# Patient Record
Sex: Male | Born: 1950
Health system: Southern US, Community
[De-identification: ages and names within clinical notes are randomized; demographics above are authoritative.]

## PROBLEM LIST (undated history)

## (undated) DIAGNOSIS — K219 Gastro-esophageal reflux disease without esophagitis: Secondary | ICD-10-CM

## (undated) DIAGNOSIS — I219 Acute myocardial infarction, unspecified: Secondary | ICD-10-CM

## (undated) DIAGNOSIS — E7800 Pure hypercholesterolemia, unspecified: Secondary | ICD-10-CM

## (undated) DIAGNOSIS — I10 Essential (primary) hypertension: Secondary | ICD-10-CM

## (undated) HISTORY — PX: JOINT REPLACEMENT: SHX530

---

## 2005-05-24 ENCOUNTER — Inpatient Hospital Stay: Payer: Self-pay | Admitting: Orthopedic Surgery

## 2010-03-20 ENCOUNTER — Ambulatory Visit: Payer: Self-pay | Admitting: Family Medicine

## 2010-03-27 ENCOUNTER — Ambulatory Visit: Payer: Self-pay | Admitting: Family Medicine

## 2010-06-19 ENCOUNTER — Ambulatory Visit: Payer: Self-pay | Admitting: Internal Medicine

## 2012-05-05 ENCOUNTER — Ambulatory Visit: Payer: Self-pay | Admitting: Internal Medicine

## 2013-01-23 ENCOUNTER — Ambulatory Visit: Payer: Self-pay

## 2013-01-26 LAB — BETA STREP CULTURE(ARMC)

## 2013-09-16 ENCOUNTER — Ambulatory Visit: Payer: Self-pay | Admitting: Emergency Medicine

## 2014-11-05 ENCOUNTER — Ambulatory Visit
Admission: EM | Admit: 2014-11-05 | Discharge: 2014-11-05 | Disposition: A | Discharge status: Home / Self Care | Payer: 59 | Attending: Family Medicine | Admitting: Family Medicine

## 2014-11-05 DIAGNOSIS — Z7982 Long term (current) use of aspirin: Secondary | ICD-10-CM | POA: Diagnosis not present

## 2014-11-05 DIAGNOSIS — I252 Old myocardial infarction: Secondary | ICD-10-CM | POA: Insufficient documentation

## 2014-11-05 DIAGNOSIS — J029 Acute pharyngitis, unspecified: Secondary | ICD-10-CM | POA: Insufficient documentation

## 2014-11-05 DIAGNOSIS — Z79899 Other long term (current) drug therapy: Secondary | ICD-10-CM | POA: Insufficient documentation

## 2014-11-05 DIAGNOSIS — E780 Pure hypercholesterolemia: Secondary | ICD-10-CM | POA: Diagnosis not present

## 2014-11-05 DIAGNOSIS — I10 Essential (primary) hypertension: Secondary | ICD-10-CM | POA: Insufficient documentation

## 2014-11-05 DIAGNOSIS — K219 Gastro-esophageal reflux disease without esophagitis: Secondary | ICD-10-CM | POA: Diagnosis not present

## 2014-11-05 HISTORY — DX: Essential (primary) hypertension: I10

## 2014-11-05 HISTORY — DX: Gastro-esophageal reflux disease without esophagitis: K21.9

## 2014-11-05 HISTORY — DX: Acute myocardial infarction, unspecified: I21.9

## 2014-11-05 HISTORY — DX: Pure hypercholesterolemia, unspecified: E78.00

## 2014-11-05 LAB — RAPID STREP SCREEN (MED CTR MEBANE ONLY): Streptococcus, Group A Screen (Direct): NEGATIVE

## 2014-11-05 MED ORDER — LORATADINE-PSEUDOEPHEDRINE ER 10-240 MG PO TB24: 1.0000 | ORAL_TABLET | Freq: Every day | ORAL | Status: DC

## 2014-11-05 NOTE — ED Provider Notes (Signed)
CSN: [id number]     Arrival date & time 11/05/14  1531 History   First MD Initiated Contact with Patient 11/05/14 1625     Chief Complaint  Patient presents with  . Sore Throat   (Consider location/radiation/quality/duration/timing/severity/associated sxs/prior Treatment) Patient is a 64 y.o. male presenting with pharyngitis. The history is provided by the patient. No language interpreter was used.  Sore Throat This is a new problem. The current episode started 12 to 24 hours ago. The problem occurs constantly. The problem has not changed since onset.Pertinent negatives include no chest pain, no abdominal pain, no headaches and no shortness of breath. Nothing aggravates the symptoms. Nothing relieves the symptoms. He has tried nothing for the symptoms.    Past Medical History  Diagnosis Date  . Hypertension   . Hypercholesteremia   . Myocardial infarct   . GERD (gastroesophageal reflux disease)    Past Surgical History  Procedure Laterality Date  . Joint replacement     Family History  Problem Relation Age of Onset  . Irregular heart beat Mother   . Diabetes Father    History  Substance Use Topics  . Smoking status: Never Smoker   . Smokeless tobacco: Not on file  . Alcohol Use: 1.2 oz/week    2 Cans of beer per week     Comment: beers per day    Review of Systems  Constitutional: Negative.   HENT: Positive for postnasal drip, rhinorrhea and sore throat.   Eyes: Negative.   Respiratory: Negative.  Negative for shortness of breath.   Cardiovascular: Negative for chest pain.  Gastrointestinal: Negative for abdominal pain.  Skin: Negative.   Neurological: Negative.  Negative for headaches.    Allergies  Review of patient's allergies indicates no known allergies.  Home Medications   Prior to Admission medications   Medication Sig Start Date End Date Taking? Authorizing Provider  aspirin 81 MG tablet Take 81 mg by mouth daily.   Yes Historical Provider, MD   atorvastatin (LIPITOR) 80 MG tablet Take 80 mg by mouth daily.   Yes Historical Provider, MD  enalapril (VASOTEC) 20 MG tablet Take 20 mg by mouth daily.   Yes Historical Provider, MD  ranitidine (ZANTAC) 150 MG capsule Take 150 mg by mouth 2 (two) times daily.   Yes Historical Provider, MD  loratadine-pseudoephedrine (CLARITIN-D 24 HOUR) 10-240 MG per 24 hr tablet Take 1 tablet by mouth daily. 11/05/14   Frederich Cha, MD   BP 130/75 mmHg  Pulse 68  Temp(Src) 96.6 F (35.9 C) (Tympanic)  Resp 16  Ht 5\' 11"  (1.803 m)  Wt 225 lb (102.059 kg)  BMI 31.39 kg/m2  SpO2 98% Physical Exam  Constitutional: He is oriented to person, place, and time. He appears well-developed and well-nourished.  HENT:  Head: Normocephalic and atraumatic.  Eyes: Pupils are equal, round, and reactive to light.  Neck: Neck supple. No tracheal deviation present.  Musculoskeletal: He exhibits no edema or tenderness.  Lymphadenopathy:    He has cervical adenopathy.  Neurological: He is alert and oriented to person, place, and time.  Skin: Skin is warm.  Psychiatric: He has a normal mood and affect.  Vitals reviewed.   ED Course  Procedures (including critical care time) Labs Review Labs Reviewed  RAPID STREP SCREEN  CULTURE, GROUP A STREP [facility])   Results for orders placed or performed during the hospital encounter of 11/05/14  Rapid strep screen  Result Value Ref Range   Streptococcus, Group A Screen (  Direct) NEGATIVE NEGATIVE    Imaging Review No results found.   MDM   1. Pharyngitis        Frederich Cha, MD 11/05/14 1723

## 2014-11-05 NOTE — ED Notes (Signed)
Woke this morning with a sore throat. Denies headache, no fever or abdominal pain

## 2014-11-05 NOTE — Discharge Instructions (Signed)

## 2014-11-08 LAB — CULTURE, GROUP A STREP (THRC)

## 2015-05-24 ENCOUNTER — Ambulatory Visit
Admission: EM | Admit: 2015-05-24 | Discharge: 2015-05-24 | Disposition: A | Discharge status: Home / Self Care | Payer: 59 | Attending: Family Medicine | Admitting: Family Medicine

## 2015-05-24 DIAGNOSIS — J0110 Acute frontal sinusitis, unspecified: Secondary | ICD-10-CM | POA: Diagnosis not present

## 2015-05-24 DIAGNOSIS — B349 Viral infection, unspecified: Secondary | ICD-10-CM | POA: Diagnosis not present

## 2015-05-24 DIAGNOSIS — H6593 Unspecified nonsuppurative otitis media, bilateral: Secondary | ICD-10-CM

## 2015-05-24 DIAGNOSIS — J029 Acute pharyngitis, unspecified: Secondary | ICD-10-CM | POA: Diagnosis not present

## 2015-05-24 LAB — RAPID STREP SCREEN (MED CTR MEBANE ONLY): Streptococcus, Group A Screen (Direct): NEGATIVE

## 2015-05-24 MED ORDER — ACETAMINOPHEN 500 MG PO TABS: 1000.0000 mg | ORAL_TABLET | Freq: Four times a day (QID) | ORAL | Status: AC | PRN

## 2015-05-24 MED ORDER — SALINE SPRAY 0.65 % NA SOLN: 2.0000 | NASAL | Status: DC

## 2015-05-24 MED ORDER — HYDROCOD POLST-CPM POLST ER 10-8 MG/5ML PO SUER: 5.0000 mL | Freq: Every evening | ORAL | Status: AC | PRN

## 2015-05-24 MED ORDER — AMOXICILLIN-POT CLAVULANATE 875-125 MG PO TABS: 1.0000 | ORAL_TABLET | Freq: Two times a day (BID) | ORAL | Status: DC

## 2015-05-24 NOTE — ED Provider Notes (Addendum)
CSN: [id number]     Arrival date & time 05/24/15  1749 History   First MD Initiated Contact with Patient 05/24/15 1842     Chief Complaint  Patient presents with  . Sore Throat   (Consider location/radiation/quality/duration/timing/severity/associated sxs/prior Treatment) HPI Comments: Married caucasian male here for evaluation sore throat x 4 days worst in am ice and a drink helps, headache frontal, sinus and nasal congestion denied sick contacts.  Had diarrhea last week resolved.  Works at [employer].  Work difficult as the day went on today.  Requesting hydrocodone cough medication to help him sleep.  Patient is a 64 y.o. male presenting with pharyngitis. The history is provided by the patient.  Sore Throat This is a new problem. The current episode started more than 2 days ago. The problem occurs constantly. The problem has not changed since onset.Associated symptoms include headaches. Pertinent negatives include no chest pain, no abdominal pain and no shortness of breath. The symptoms are aggravated by sneezing, drinking, coughing, eating and swallowing. The symptoms are relieved by ice and drinking. He has tried rest, food and water for the symptoms. The treatment provided mild relief.    Past Medical History  Diagnosis Date  . Hypertension   . Hypercholesteremia   . Myocardial infarct ([place])   . GERD (gastroesophageal reflux disease)    Past Surgical History  Procedure Laterality Date  . Joint replacement     Family History  Problem Relation Age of Onset  . Irregular heart beat Mother   . Diabetes Father    Social History  Substance Use Topics  . Smoking status: Never Smoker   . Smokeless tobacco: None  . Alcohol Use: 1.2 oz/week    2 Cans of beer per week     Comment: beers per day    Review of Systems  Constitutional: Positive for fever, chills, activity change and fatigue. Negative for diaphoresis, appetite change and  unexpected weight change.  HENT: Positive for congestion, postnasal drip, rhinorrhea, sinus pressure and sore throat. Negative for dental problem, drooling, ear discharge, ear pain, facial swelling, hearing loss, mouth sores, nosebleeds, sneezing, tinnitus, trouble swallowing and voice change.   Eyes: Negative for photophobia, pain, discharge, redness, itching and visual disturbance.  Respiratory: Positive for cough. Negative for choking, chest tightness, shortness of breath, wheezing and stridor.   Cardiovascular: Negative for chest pain, palpitations and leg swelling.  Gastrointestinal: Positive for diarrhea. Negative for nausea, vomiting, abdominal pain, constipation, blood in stool and abdominal distention.  Endocrine: Negative for cold intolerance and heat intolerance.  Genitourinary: Negative for dysuria.  Musculoskeletal: Negative for myalgias, back pain, joint swelling, arthralgias, gait problem, neck pain and neck stiffness.  Skin: Negative for color change, pallor, rash and wound.  Allergic/Immunologic: Negative for environmental allergies, food allergies and immunocompromised state.  Neurological: Positive for headaches. Negative for dizziness, tremors, seizures, syncope, facial asymmetry, speech difficulty, weakness, light-headedness and numbness.  Hematological: Negative for adenopathy. Does not bruise/bleed easily.  Psychiatric/Behavioral: Negative for behavioral problems, confusion, sleep disturbance and agitation.    Allergies  Review of patient's allergies indicates no known allergies.  Home Medications   Prior to Admission medications   Medication Sig Start Date End Date Taking? Authorizing Provider  atorvastatin (LIPITOR) 80 MG tablet Take 80 mg by mouth daily.   Yes Historical Provider, MD  enalapril (VASOTEC) 20 MG tablet Take 20 mg by mouth daily.   Yes Historical Provider, MD  naproxen (NAPROSYN) 500 MG tablet Take 500  mg by mouth 2 (two) times daily with a meal.   Yes  Historical Provider, MD  ranitidine (ZANTAC) 150 MG capsule Take 150 mg by mouth 2 (two) times daily.   Yes Historical Provider, MD  acetaminophen (TYLENOL) 500 MG tablet Take 2 tablets (1,000 mg total) by mouth every 6 (six) hours as needed for mild pain, moderate pain, fever or headache. 05/24/15 05/30/15  Olen Cordial, NP  amoxicillin-clavulanate (AUGMENTIN) 875-125 MG tablet Take 1 tablet by mouth every 12 (twelve) hours. 05/24/15   Olen Cordial, NP  chlorpheniramine-HYDROcodone (TUSSIONEX PENNKINETIC ER) 10-8 MG/5ML SUER Take 5 mLs by mouth at bedtime as needed for cough. 05/24/15 05/30/15  Olen Cordial, NP  sodium chloride (OCEAN) 0.65 % SOLN nasal spray Place 2 sprays into both nostrils every 2 (two) hours while awake. 05/24/15   Olen Cordial, NP   Meds Ordered and Administered this Visit  Medications - No data to display  BP 121/68 mmHg  Pulse 91  Temp(Src) 97.8 F (36.6 C) (Tympanic)  Resp 15  Ht 5\' 11"  (1.803 m)  Wt 220 lb (99.791 kg)  BMI 30.70 kg/m2  SpO2 96% No data found.   Physical Exam  Constitutional: He is oriented to person, place, and time. Vital signs are normal. He appears well-developed and well-nourished. He is active and cooperative.  Non-toxic appearance. He does not have a sickly appearance. He appears ill. No distress.  HENT:  Head: Normocephalic and atraumatic.  Right Ear: Hearing, external ear and ear canal normal. A middle ear effusion is present.  Left Ear: Hearing, external ear and ear canal normal. A middle ear effusion is present.  Nose: Mucosal edema and rhinorrhea present. No nose lacerations, sinus tenderness, nasal deformity, septal deviation or nasal septal hematoma. No epistaxis.  No foreign bodies. Right sinus exhibits maxillary sinus tenderness and frontal sinus tenderness. Left sinus exhibits maxillary sinus tenderness and frontal sinus tenderness.  Mouth/Throat: Uvula is midline and mucous membranes are normal. Mucous  membranes are not pale, not dry and not cyanotic. He does not have dentures. No oral lesions. No trismus in the jaw. Normal dentition. No dental abscesses, uvula swelling, lacerations or dental caries. Posterior oropharyngeal edema and posterior oropharyngeal erythema present. No oropharyngeal exudate or tonsillar abscesses.  Bilateral TMs with air fluid level; bilateral hearing aids; bilateral nasal turbinates with edema/erythema; lips cracked and bleeding slightly left; cobblestoning posterior pharyx; macular erythema hard palate and tonsils 2+ bilaterally edema/erythema  Eyes: Conjunctivae, EOM and lids are normal. Pupils are equal, round, and reactive to light. Right eye exhibits no chemosis, no discharge, no exudate and no hordeolum. No foreign body present in the right eye. Left eye exhibits no chemosis, no discharge, no exudate and no hordeolum. No foreign body present in the left eye. Right conjunctiva is not injected. Right conjunctiva has no hemorrhage. Left conjunctiva is not injected. Left conjunctiva has no hemorrhage. No scleral icterus. Right eye exhibits normal extraocular motion and no nystagmus. Left eye exhibits normal extraocular motion and no nystagmus. Right pupil is round and reactive. Left pupil is round and reactive. Pupils are equal.  Neck: Trachea normal and normal range of motion. Neck supple. No tracheal tenderness, no spinous process tenderness and no muscular tenderness present. No rigidity. No tracheal deviation, no edema, no erythema and normal range of motion present. No thyroid mass and no thyromegaly present.  Cardiovascular: Normal rate, regular rhythm, S1 normal, S2 normal, normal heart sounds and intact distal pulses.  PMI is  not displaced.  Exam reveals no gallop and no friction rub.   No murmur heard. Pulmonary/Chest: Effort normal and breath sounds normal. No stridor. No respiratory distress. He has no decreased breath sounds. He has no wheezes. He has no rhonchi. He  has no rales.  Negative egophany x 4 quads  Abdominal: Soft. Normal appearance and bowel sounds are normal. He exhibits no shifting dullness, no distension, no pulsatile liver, no fluid wave, no abdominal bruit, no ascites, no pulsatile midline mass and no mass. There is no hepatosplenomegaly. There is no tenderness. There is no rigidity, no rebound, no guarding, no tenderness at McBurney's point and negative Murphy's sign. Hernia confirmed negative in the ventral area.  Dull to percussion x 4 quads  Musculoskeletal: Normal range of motion. He exhibits no edema or tenderness.       Right shoulder: Normal.       Left shoulder: Normal.       Right elbow: Normal.      Left elbow: Normal.       Right hip: Normal.       Left hip: Normal.       Right knee: Normal.       Left knee: Normal.       Right ankle: Normal.       Left ankle: Normal.       Right hand: Normal.       Left hand: Normal.  Lymphadenopathy:       Head (right side): No submental, no submandibular, no tonsillar, no preauricular, no posterior auricular and no occipital adenopathy present.       Head (left side): No submental, no submandibular, no tonsillar, no preauricular, no posterior auricular and no occipital adenopathy present.    He has no cervical adenopathy.       Right cervical: No superficial cervical, no deep cervical and no posterior cervical adenopathy present.      Left cervical: No superficial cervical, no deep cervical and no posterior cervical adenopathy present.  Neurological: He is alert and oriented to person, place, and time. He displays no atrophy and no tremor. No cranial nerve deficit or sensory deficit. He exhibits normal muscle tone. He displays no seizure activity. Coordination and gait normal. GCS eye subscore is 4. GCS verbal subscore is 5. GCS motor subscore is 6.  Skin: Skin is warm, dry and intact. No abrasion, no bruising, no burn, no ecchymosis, no laceration, no lesion, no petechiae and no rash  noted. He is not diaphoretic. No cyanosis or erythema. No pallor. Nails show no clubbing.  Psychiatric: He has a normal mood and affect. His speech is normal and behavior is normal. Judgment and thought content normal. Cognition and memory are normal.  Nursing note and vitals reviewed.   ED Course  Procedures (including critical care time)  Labs Review Labs Reviewed  RAPID STREP SCREEN (NOT AT [facility])  CULTURE, GROUP A STREP ([facility] ONLY)    Imaging Review No results found.  1923 reviewed [place] Controlled Substances website patient has not received any controlled substances in past year.   MDM   1. Acute frontal sinusitis, recurrence not specified   2. Otitis media with effusion, bilateral   3. Acute pharyngitis, unspecified etiology   4. Viral illness    Supportive treatment.   No evidence of invasive bacterial infection, non toxic and well hydrated.  This is most likely self limiting viral infection.  I do not see where any further testing or imaging is necessary  at this time.   I will suggest supportive care, rest, good hygiene and encourage the patient to take adequate fluids.  The patient is to return to clinic or EMERGENCY ROOM if symptoms worsen or change significantly e.g. ear pain, fever, purulent discharge from ears or bleeding.  Exitcare handout on otitis media with effusion given to patient.  Patient verbalized agreement and understanding of treatment plan.    Patient notified rapid strep negative. tussionex 81ml po bedtime prn avoid alcohol intake and caution will cause drowsiness.  Avoid taking this medication at work.   Suspect Viral illness: no evidence of invasive bacterial infection, non toxic and well hydrated.  This is most likely self limiting viral infection.  I do not see where any further testing or imaging is necessary at this time.   I will suggest supportive care, rest, good hygiene and encourage the patient to take adequate fluids.  Does not require work excuse.   Notified patient staff will call with culture results once available next 48+ hours. nasal saline 1-2 sprays each nostril prn q2h, acetaminophen 1000mg  po QID prn.  Discussed honey with lemon and salt water gargles for comfort also.  The patient is to return to clinic or EMERGENCY ROOM if symptoms worsen or change significantly e.g. fever, lethargy, SOB, wheezing.  Exitcare handout on viral illness given to patient.  Patient verbalized agreement and understanding of treatment plan.    Rx augmentin 875mg  po BID x 10 days.  Nasal saline.  Patient refused flonase or nasacort Rx as does not like nose sprays.  Discussed sudafed will counteract his blood pressure medications as does naproxen.  Recommend tylenol 1000mg  po QID prn pain/fever.  No evidence of systemic bacterial infection, non toxic and well hydrated.  I do not see where any further testing or imaging is necessary at this time.   I will suggest supportive care, rest, good hygiene and encourage the patient to take adequate fluids.  The patient is to return to clinic or EMERGENCY ROOM if symptoms worsen or change significantly.  Exitcare handout on sinusitis given to patient.  Patient verbalized agreement and understanding of treatment plan and had no further questions at this time.   P2:  Hand washing and cover cough  Works excuse x 48 hours.  I have recommended clear fluids and bland diet.  Avoid dairy/spicy, fried and large portions of meat while having nausea.  If vomiting hold po intake x 1 hour.  Then sips clear fluids like broths, ginger ale, power ade, gatorade, pedialyte may advance to soft/bland if no vomiting x 24 hours and appetite returned otherwise hydration main focus.     Return to the clinic if symptoms persist or worsen; I have alerted the patient to call if high fever, dehydration, marked weakness, fainting, increased abdominal pain, blood in stool or vomit (red or black).   Exitcare handout on gastroenteritis given to patient. Patient  verbalized agreement and understanding of treatment plan and had no further questions at this time.   Olen Cordial, NP 05/24/15 1930  28 May 2015 at [place] patient via telephone notified throat culture normal negative. Patient reported feeling slightly better still with green productive cough on augmentin.  Tussionex helping him to sleep at night.  To contact me or PCM if still productive cough when augmentin finished.  Patient verbalized understanding of information/instructions, agreed with plan of care and had no further questions at this time.  Olen Cordial, NP 05/28/15 1851

## 2015-05-24 NOTE — ED Notes (Signed)
Patient complains of sore throat, runny nose, cough and congestion with headaches. Patient states that he has been running a fever off and on for a few days. He states that his symptoms started on Thursday of last week-4 days ago.

## 2015-05-24 NOTE — Discharge Instructions (Signed)
Viral Gastroenteritis °Viral gastroenteritis is also known as stomach flu. This condition affects the stomach and intestinal tract. It can cause sudden diarrhea and vomiting. The illness typically lasts 3 to 8 days. Most people develop an immune response that eventually gets rid of the virus. While this natural response develops, the virus can make you quite ill. °CAUSES  °Many different viruses can cause gastroenteritis, such as rotavirus or noroviruses. You can catch one of these viruses by consuming contaminated food or water. You may also catch a virus by sharing utensils or other personal items with an infected person or by touching a contaminated surface. °SYMPTOMS  °The most common symptoms are diarrhea and vomiting. These problems can cause a severe loss of body fluids (dehydration) and a body salt (electrolyte) imbalance. Other symptoms may include: °· Fever. °· Headache. °· Fatigue. °· Abdominal pain. °DIAGNOSIS  °Your caregiver can usually diagnose viral gastroenteritis based on your symptoms and a physical exam. A stool sample may also be taken to test for the presence of viruses or other infections. °TREATMENT  °This illness typically goes away on its own. Treatments are aimed at rehydration. The most serious cases of viral gastroenteritis involve vomiting so severely that you are not able to keep fluids down. In these cases, fluids must be given through an intravenous line (IV). °HOME CARE INSTRUCTIONS  °· Drink enough fluids to keep your urine clear or pale yellow. Drink small amounts of fluids frequently and increase the amounts as tolerated. °· Ask your caregiver for specific rehydration instructions. °· Avoid: °¨ Foods high in sugar. °¨ Alcohol. °¨ Carbonated drinks. °¨ Tobacco. °¨ Juice. °¨ Caffeine drinks. °¨ Extremely hot or cold fluids. °¨ Fatty, greasy foods. °¨ Too much intake of anything at one time. °¨ Dairy products until 24 to 48 hours after diarrhea stops. °· You may consume probiotics.  Probiotics are active cultures of beneficial bacteria. They may lessen the amount and number of diarrheal stools in adults. Probiotics can be found in yogurt with active cultures and in supplements. °· Wash your hands well to avoid spreading the virus. °· Only take over-the-counter or prescription medicines for pain, discomfort, or fever as directed by your caregiver. Do not give aspirin to children. Antidiarrheal medicines are not recommended. °· Ask your caregiver if you should continue to take your regular prescribed and over-the-counter medicines. °· Keep all follow-up appointments as directed by your caregiver. °SEEK IMMEDIATE MEDICAL CARE IF:  °· You are unable to keep fluids down. °· You do not urinate at least once every 6 to 8 hours. °· You develop shortness of breath. °· You notice blood in your stool or vomit. This may look like coffee grounds. °· You have abdominal pain that increases or is concentrated in one small area (localized). °· You have persistent vomiting or diarrhea. °· You have a fever. °· The patient is a child younger than 3 months, and he or she has a fever. °· The patient is a child older than 3 months, and he or she has a fever and persistent symptoms. °· The patient is a child older than 3 months, and he or she has a fever and symptoms suddenly get worse. °· The patient is a baby, and he or she has no tears when crying. °MAKE SURE YOU:  °· Understand these instructions. °· Will watch your condition. °· Will get help right away if you are not doing well or get worse. °  °This information is not intended to replace   advice given to you by your health care provider. Make sure you discuss any questions you have with your health care provider.   Document Released: 06/19/2005 Document Revised: 09/11/2011 Document Reviewed: 04/05/2011 Elsevier Interactive Patient Education 2016 [employer] Viral Infections A virus is a type of germ. Viruses can cause:  Minor sore throats.  Aches and  pains.  Headaches.  Runny nose.  Rashes.  Watery eyes.  Tiredness.  Coughs.  Loss of appetite.  Feeling sick to your stomach (nausea).  Throwing up (vomiting).  Watery poop (diarrhea). HOME CARE   Only take medicines as told by your doctor.  Drink enough water and fluids to keep your pee (urine) clear or pale yellow. Sports drinks are a good choice.  Get plenty of rest and eat healthy. Soups and broths with crackers or rice are fine. GET HELP RIGHT AWAY IF:   You have a very bad headache.  You have shortness of breath.  You have chest pain or neck pain.  You have an unusual rash.  You cannot stop throwing up.  You have watery poop that does not stop.  You cannot keep fluids down.  You or your child has a temperature by mouth above 102 F (38.9 C), not controlled by medicine.  Your baby is older than 3 months with a rectal temperature of 102 F (38.9 C) or higher.  Your baby is 60 months old or younger with a rectal temperature of 100.4 F (38 C) or higher. MAKE SURE YOU:   Understand these instructions.  Will watch this condition.  Will get help right away if you are not doing well or get worse.   This information is not intended to replace advice given to you by your health care provider. Make sure you discuss any questions you have with your health care provider.   Document Released: 06/01/2008 Document Revised: 09/11/2011 Document Reviewed: 11/25/2014 Elsevier Interactive Patient Education 2016 Elsevier Inc. Pharyngitis Pharyngitis is redness, pain, and swelling (inflammation) of your pharynx.  CAUSES  Pharyngitis is usually caused by infection. Most of the time, these infections are from viruses (viral) and are part of a cold. However, sometimes pharyngitis is caused by bacteria (bacterial). Pharyngitis can also be caused by allergies. Viral pharyngitis may be spread from person to person by coughing, sneezing, and personal items or utensils  (cups, forks, spoons, toothbrushes). Bacterial pharyngitis may be spread from person to person by more intimate contact, such as kissing.  SIGNS AND SYMPTOMS  Symptoms of pharyngitis include:   Sore throat.   Tiredness (fatigue).   Low-grade fever.   Headache.  Joint pain and muscle aches.  Skin rashes.  Swollen lymph nodes.  Plaque-like film on throat or tonsils (often seen with bacterial pharyngitis). DIAGNOSIS  Your health care provider will ask you questions about your illness and your symptoms. Your medical history, along with a physical exam, is often all that is needed to diagnose pharyngitis. Sometimes, a rapid strep test is done. Other lab tests may also be done, depending on the suspected cause.  TREATMENT  Viral pharyngitis will usually get better in 3-4 days without the use of medicine. Bacterial pharyngitis is treated with medicines that kill germs (antibiotics).  HOME CARE INSTRUCTIONS   Drink enough water and fluids to keep your urine clear or pale yellow.   Only take over-the-counter or prescription medicines as directed by your health care provider:   If you are prescribed antibiotics, make sure you finish them even if you start  to feel better.   Do not take aspirin.   Get lots of rest.   Gargle with 8 oz of salt water ( tsp of salt per 1 qt of water) as often as every 1-2 hours to soothe your throat.   Throat lozenges (if you are not at risk for choking) or sprays may be used to soothe your throat. SEEK MEDICAL CARE IF:   You have large, tender lumps in your neck.  You have a rash.  You cough up green, yellow-brown, or bloody spit. SEEK IMMEDIATE MEDICAL CARE IF:   Your neck becomes stiff.  You drool or are unable to swallow liquids.  You vomit or are unable to keep medicines or liquids down.  You have severe pain that does not go away with the use of recommended medicines.  You have trouble breathing (not caused by a stuffy  nose). MAKE SURE YOU:   Understand these instructions.  Will watch your condition.  Will get help right away if you are not doing well or get worse.   This information is not intended to replace advice given to you by your health care provider. Make sure you discuss any questions you have with your health care provider.   Document Released: 06/19/2005 Document Revised: 04/09/2013 Document Reviewed: 02/24/2013 Elsevier Interactive Patient Education 2016 [place]. Otitis Media With Effusion Otitis media with effusion is the presence of fluid in the middle ear. This is a common problem in children, which often follows ear infections. It may be present for weeks or longer after the infection. Unlike an acute ear infection, otitis media with effusion refers only to fluid behind the ear drum and not infection. Children with repeated ear and sinus infections and allergy problems are the most likely to get otitis media with effusion. CAUSES  The most frequent cause of the fluid buildup is dysfunction of the eustachian tubes. These are the tubes that drain fluid in the ears to the back of the nose (nasopharynx). SYMPTOMS   The main symptom of this condition is hearing loss. As a result, you or your child may:  Listen to the TV at a loud volume.  Not respond to questions.  Ask "what" often when spoken to.  Mistake or confuse one sound or word for another.  There may be a sensation of fullness or pressure but usually not pain. DIAGNOSIS   Your health care provider will diagnose this condition by examining you or your child's ears.  Your health care provider may test the pressure in you or your child's ear with a tympanometer.  A hearing test may be conducted if the problem persists. TREATMENT   Treatment depends on the duration and the effects of the effusion.  Antibiotics, decongestants, nose drops, and cortisone-type drugs (tablets or nasal spray) may not be helpful.  Children  with persistent ear effusions may have delayed language or behavioral problems. Children at risk for developmental delays in hearing, learning, and speech may require referral to a specialist earlier than children not at risk.  You or your child's health care provider may suggest a referral to an ear, nose, and throat surgeon for treatment. The following may help restore normal hearing:  Drainage of fluid.  Placement of ear tubes (tympanostomy tubes).  Removal of adenoids (adenoidectomy). HOME CARE INSTRUCTIONS   Avoid secondhand smoke.  Infants who are breastfed are less likely to have this condition.  Avoid feeding infants while they are lying flat.  Avoid known environmental allergens.  Avoid people who are sick. SEEK MEDICAL CARE IF:   Hearing is not better in 3 months.  Hearing is worse.  Ear pain.  Drainage from the ear.  Dizziness. MAKE SURE YOU:   Understand these instructions.  Will watch your condition.  Will get help right away if you are not doing well or get worse.   This information is not intended to replace advice given to you by your health care provider. Make sure you discuss any questions you have with your health care provider.   Document Released: 07/27/2004 Document Revised: 07/10/2014 Document Reviewed: 01/14/2013 Elsevier Interactive Patient Education 2016 Elsevier Inc. Sinusitis, Adult Sinusitis is redness, soreness, and inflammation of the paranasal sinuses. Paranasal sinuses are air pockets within the bones of your face. They are located beneath your eyes, in the middle of your forehead, and above your eyes. In healthy paranasal sinuses, mucus is able to drain out, and air is able to circulate through them by way of your nose. However, when your paranasal sinuses are inflamed, mucus and air can become trapped. This can allow bacteria and other germs to grow and cause infection. Sinusitis can develop quickly and last only a short time (acute) or  continue over a long period (chronic). Sinusitis that lasts for more than 12 weeks is considered chronic. CAUSES Causes of sinusitis include:  Allergies.  Structural abnormalities, such as displacement of the cartilage that separates your nostrils (deviated septum), which can decrease the air flow through your nose and sinuses and affect sinus drainage.  Functional abnormalities, such as when the small hairs (cilia) that line your sinuses and help remove mucus do not work properly or are not present. SIGNS AND SYMPTOMS Symptoms of acute and chronic sinusitis are the same. The primary symptoms are pain and pressure around the affected sinuses. Other symptoms include:  Upper toothache.  Earache.  Headache.  Bad breath.  Decreased sense of smell and taste.  A cough, which worsens when you are lying flat.  Fatigue.  Fever.  Thick drainage from your nose, which often is green and may contain pus (purulent).  Swelling and warmth over the affected sinuses. DIAGNOSIS Your health care provider will perform a physical exam. During your exam, your health care provider may perform any of the following to help determine if you have acute sinusitis or chronic sinusitis:  Look in your nose for signs of abnormal growths in your nostrils (nasal polyps).  Tap over the affected sinus to check for signs of infection.  View the inside of your sinuses using an imaging device that has a light attached (endoscope). If your health care provider suspects that you have chronic sinusitis, one or more of the following tests may be recommended:  Allergy tests.  Nasal culture. A sample of mucus is taken from your nose, sent to a lab, and screened for bacteria.  Nasal cytology. A sample of mucus is taken from your nose and examined by your health care provider to determine if your sinusitis is related to an allergy. TREATMENT Most cases of acute sinusitis are related to a viral infection and will  resolve on their own within 10 days. Sometimes, medicines are prescribed to help relieve symptoms of both acute and chronic sinusitis. These may include pain medicines, decongestants, nasal steroid sprays, or saline sprays. However, for sinusitis related to a bacterial infection, your health care provider will prescribe antibiotic medicines. These are medicines that will help kill the bacteria causing the infection. Rarely, sinusitis is caused  by a fungal infection. In these cases, your health care provider will prescribe antifungal medicine. For some cases of chronic sinusitis, surgery is needed. Generally, these are cases in which sinusitis recurs more than 3 times per year, despite other treatments. HOME CARE INSTRUCTIONS  Drink plenty of water. Water helps thin the mucus so your sinuses can drain more easily.  Use a humidifier.  Inhale steam 3-4 times a day (for example, sit in the bathroom with the shower running).  Apply a warm, moist washcloth to your face 3-4 times a day, or as directed by your health care provider.  Use saline nasal sprays to help moisten and clean your sinuses.  Take medicines only as directed by your health care provider.  If you were prescribed either an antibiotic or antifungal medicine, finish it all even if you start to feel better. SEEK IMMEDIATE MEDICAL CARE IF:  You have increasing pain or severe headaches.  You have nausea, vomiting, or drowsiness.  You have swelling around your face.  You have vision problems.  You have a stiff neck.  You have difficulty breathing.   This information is not intended to replace advice given to you by your health care provider. Make sure you discuss any questions you have with your health care provider.   Document Released: 06/19/2005 Document Revised: 07/10/2014 Document Reviewed: 07/04/2011 Elsevier Interactive Patient Education [employer].

## 2015-05-26 LAB — CULTURE, GROUP A STREP (THRC)

## 2015-06-02 ENCOUNTER — Telehealth: Payer: Self-pay

## 2015-06-02 NOTE — ED Notes (Signed)
Patient called stating was returning a call from Gerarda Fraction NP. He states she called an offered to call him in "a second round of antibiotics". Chart reviewed. Patient states is still on Augmentin. Informed patient that from reading notes, he was instructed to return to [place] or f/u with PCP if still having colored drainage after completing Augmentin.

## 2015-06-03 MED ORDER — AZITHROMYCIN 250 MG PO TABS: 250.0000 mg | ORAL_TABLET | Freq: Every day | ORAL | Status: AC

## 2015-06-03 NOTE — Telephone Encounter (Signed)
Patient contacted via telephone and reported still some rhinitis and post nasal drip productive green cough despite augmentin and nasal saline.  Low grade fever.  Rx for azithromycin 500mg  po today and 250mg  po daily days 2-5 sent to [facility] location.  Patient to follow up for re-evaluation if no improvement of symptoms with PCM or [facility] provider.  Patient verbalized understanding of information/instructions, agreed with plan of care and had no further questions at this time.

## 2017-06-03 ENCOUNTER — Ambulatory Visit
Admission: EM | Admit: 2017-06-03 | Discharge: 2017-06-03 | Disposition: A | Discharge status: Home / Self Care | Payer: 59 | Attending: Family Medicine | Admitting: Family Medicine

## 2017-06-03 ENCOUNTER — Encounter: Payer: Self-pay | Admitting: Gynecology

## 2017-06-03 ENCOUNTER — Other Ambulatory Visit: Payer: Self-pay

## 2017-06-03 DIAGNOSIS — J0100 Acute maxillary sinusitis, unspecified: Secondary | ICD-10-CM

## 2017-06-03 LAB — RAPID STREP SCREEN (MED CTR MEBANE ONLY): Streptococcus, Group A Screen (Direct): NEGATIVE

## 2017-06-03 MED ORDER — AMOXICILLIN-POT CLAVULANATE 875-125 MG PO TABS: 1.0000 | ORAL_TABLET | Freq: Two times a day (BID) | ORAL | 0 refills | Status: DC

## 2017-06-03 MED ORDER — HYDROCOD POLST-CPM POLST ER 10-8 MG/5ML PO SUER: 5.0000 mL | Freq: Two times a day (BID) | ORAL | 0 refills | Status: DC | PRN

## 2017-06-03 NOTE — ED Triage Notes (Signed)
Patient c/o sinusitis and sore throat x 1 week.

## 2017-06-03 NOTE — ED Provider Notes (Signed)
[facility]    CSN: [id number] Arrival date & time: 06/03/17  0803  History   Chief Complaint Chief Complaint  Patient presents with  . Sore Throat    HPI  66 year old male presents with sore throat, drainage, cough.  Patient has been sick for the past week.  He states that he has had sore throat, postnasal drainage, sinus pressure and congestion, and cough.  Symptoms are moderate in severity.  He is used over-the-counter medications without improvement.  No associated fevers or chills.  No reported sick contacts.  No known exacerbating relieving factors.  No other complaints or concerns at this time.  Past Medical History:  Diagnosis Date  . GERD (gastroesophageal reflux disease)   . Hypercholesteremia   . Hypertension   . Myocardial infarct [facility])    Past Surgical History:  Procedure Laterality Date  . JOINT REPLACEMENT     Home Medications    Prior to Admission medications   Medication Sig Start Date End Date Taking? Authorizing Provider  atorvastatin (LIPITOR) 80 MG tablet Take 80 mg by mouth daily.   Yes [provider]  enalapril (VASOTEC) 20 MG tablet Take 20 mg by mouth daily.   Yes [provider]  naproxen (NAPROSYN) 500 MG tablet Take 500 mg by mouth 2 (two) times daily with a meal.   Yes [provider]  ranitidine (ZANTAC) 150 MG capsule Take 150 mg by mouth 2 (two) times daily.   Yes [provider]  amoxicillin-clavulanate (AUGMENTIN) 875-125 MG tablet Take 1 tablet by mouth every 12 (twelve) hours. 06/03/17   Coral Spikes, DO  chlorpheniramine-HYDROcodone (TUSSIONEX PENNKINETIC ER) 10-8 MG/5ML SUER Take 5 mLs by mouth every 12 (twelve) hours as needed. 06/03/17   Coral Spikes, DO  sodium chloride (OCEAN) 0.65 % SOLN nasal spray Place 2 sprays into both nostrils every 2 (two) hours while awake. 05/24/15   Betancourt, Aura Fey, NP    Family History Family History  Problem Relation Age of Onset  . Irregular heart  beat Mother   . Diabetes Father     Social History Social History   Tobacco Use  . Smoking status: Never Smoker  . Smokeless tobacco: Never Used  Substance Use Topics  . Alcohol use: Yes    Alcohol/week: 1.2 oz    Types: 2 Cans of beer per week    Comment: beers per day  . Drug use: Not on file     Allergies   Patient has no known allergies.   Review of Systems Review of Systems  Constitutional: Negative for fever.  HENT: Positive for congestion, postnasal drip, sinus pressure and sore throat.   Respiratory: Positive for cough.    Physical Exam Triage Vital Signs ED Triage Vitals  Enc Vitals Group     BP 06/03/17 0814 (!) 147/68     Pulse Rate 06/03/17 0814 84     Resp 06/03/17 0814 16     Temp 06/03/17 0814 98.1 F (36.7 C)     Temp Source 06/03/17 0814 Oral     SpO2 06/03/17 0814 98 %     Weight 06/03/17 0816 226 lb (102.5 kg)     Height 06/03/17 0816 5\' 11"  (1.803 m)     Head Circumference --      Peak Flow --      Pain Score 06/03/17 0816 6     Pain Loc --      Pain Edu? --  Excl. in GC? --    No data found.  Updated Vital Signs BP (!) 147/68 (BP Location: Left Arm)   Pulse 84   Temp 98.1 F (36.7 C) (Oral)   Resp 16   Ht 5\' 11"  (1.803 m)   Wt 226 lb (102.5 kg)   SpO2 98%   BMI 31.52 kg/m   Physical Exam  Constitutional: He is oriented to person, place, and time. He appears well-developed and well-nourished. No distress.  HENT:  Head: Normocephalic and atraumatic.  Oropharynx with mild erythema.  No exudate.  TMs normal bilaterally. Mild maxillary sinus tenderness to palpation.  Neck: Neck supple.  Cardiovascular: Normal rate and regular rhythm.  No murmur heard. Pulmonary/Chest: Effort normal and breath sounds normal. He has no wheezes. He has no rales.  Lymphadenopathy:    He has no cervical adenopathy.  Neurological: He is alert and oriented to person, place, and time.  Psychiatric: He has a normal mood and affect. His behavior is  normal.  Nursing note and vitals reviewed.  UC Treatments / Results  Labs (all labs ordered are listed, but only abnormal results are displayed) Labs Reviewed  RAPID STREP SCREEN (NOT AT [facility])  CULTURE, GROUP A STREP [facility])    EKG  EKG Interpretation None       Radiology No results found.  Procedures Procedures (including critical care time)  Medications Ordered in UC Medications - No data to display   Initial Impression / Assessment and Plan / UC Course  I have reviewed the triage vital signs and the nursing notes.  Pertinent labs & imaging results that were available during my care of the patient were reviewed by me and considered in my medical decision making (see chart for details).     66 year old male presents with acute sinusitis.  Treated with Augmentin.  Tussionex for cough.  Final Clinical Impressions(s) / UC Diagnoses   Final diagnoses:  Acute non-recurrent maxillary sinusitis    ED Discharge Orders        Ordered    amoxicillin-clavulanate (AUGMENTIN) 875-125 MG tablet  Every 12 hours     06/03/17 0828    chlorpheniramine-HYDROcodone (TUSSIONEX PENNKINETIC ER) 10-8 MG/5ML SUER  Every 12 hours PRN     06/03/17 0828      Controlled Substance Prescriptions [place] Controlled Substance Registry consulted? No   Coral Spikes, [place] 06/03/17 [phone]

## 2017-06-06 LAB — CULTURE, GROUP A STREP (THRC)

## 2017-06-30 ENCOUNTER — Ambulatory Visit
Admission: EM | Admit: 2017-06-30 | Discharge: 2017-06-30 | Disposition: A | Discharge status: Home / Self Care | Payer: 59 | Attending: Family Medicine | Admitting: Family Medicine

## 2017-06-30 ENCOUNTER — Other Ambulatory Visit: Payer: Self-pay

## 2017-06-30 ENCOUNTER — Encounter: Payer: Self-pay | Admitting: *Deleted

## 2017-06-30 DIAGNOSIS — J0100 Acute maxillary sinusitis, unspecified: Secondary | ICD-10-CM

## 2017-06-30 DIAGNOSIS — R05 Cough: Secondary | ICD-10-CM | POA: Diagnosis not present

## 2017-06-30 MED ORDER — AMOXICILLIN-POT CLAVULANATE 875-125 MG PO TABS: 1.0000 | ORAL_TABLET | Freq: Two times a day (BID) | ORAL | 0 refills | Status: DC

## 2017-06-30 MED ORDER — HYDROCOD POLST-CPM POLST ER 10-8 MG/5ML PO SUER: 5.0000 mL | Freq: Two times a day (BID) | ORAL | 0 refills | Status: DC | PRN

## 2017-06-30 NOTE — ED Provider Notes (Signed)
[facility]    CSN: [id number] Arrival date & time: 06/30/17  0915     History   Chief Complaint Chief Complaint  Patient presents with  . Cough  . Nasal Congestion    HPI Richard Jensen is a 66 y.o. male.   66 yo male with a c/o sinus pressure, sinus headaches which did not completely resolved since he was seen here about 3 weeks ago. States symptoms improved and seemed to be resolving on the Augmentin prescribed, however symptoms did not seem to completely go away. Denies any chest pain or shortness of breath.    The history is provided by the patient.    Past Medical History:  Diagnosis Date  . GERD (gastroesophageal reflux disease)   . Hypercholesteremia   . Hypertension   . Myocardial infarct ([place])     There are no active problems to display for this patient.   Past Surgical History:  Procedure Laterality Date  . JOINT REPLACEMENT         Home Medications    Prior to Admission medications   Medication Sig Start Date End Date Taking? Authorizing Provider  amLODipine (NORVASC) 10 MG tablet Take 10 mg by mouth daily.   Yes [provider]  atorvastatin (LIPITOR) 80 MG tablet Take 80 mg by mouth daily.   Yes [provider]  enalapril (VASOTEC) 20 MG tablet Take 20 mg by mouth daily.   Yes [provider]  naproxen (NAPROSYN) 500 MG tablet Take 500 mg by mouth 2 (two) times daily with a meal.   Yes [provider]  ranitidine (ZANTAC) 150 MG capsule Take 150 mg by mouth 2 (two) times daily.   Yes [provider]  sodium chloride (OCEAN) 0.65 % SOLN nasal spray Place 2 sprays into both nostrils every 2 (two) hours while awake. 05/24/15  Yes Betancourt, Aura Fey, NP  amoxicillin-clavulanate (AUGMENTIN) 875-125 MG tablet Take 1 tablet by mouth every 12 (twelve) hours. 06/30/17   Norval Gable, MD  chlorpheniramine-HYDROcodone (TUSSIONEX PENNKINETIC ER) 10-8 MG/5ML SUER Take 5 mLs by mouth every 12 (twelve)  hours as needed. 06/30/17   Norval Gable, MD    Family History Family History  Problem Relation Age of Onset  . Irregular heart beat Mother   . Diabetes Father     Social History Social History   Tobacco Use  . Smoking status: Never Smoker  . Smokeless tobacco: Never Used  Substance Use Topics  . Alcohol use: Yes    Alcohol/week: 1.2 oz    Types: 2 Cans of beer per week    Comment: beers per day  . Drug use: Not on file     Allergies   Patient has no known allergies.   Review of Systems Review of Systems   Physical Exam Triage Vital Signs ED Triage Vitals  Enc Vitals Group     BP 06/30/17 0943 130/65     Pulse Rate 06/30/17 0943 (!) 53     Resp 06/30/17 0943 16     Temp 06/30/17 0943 97.7 F (36.5 C)     Temp Source 06/30/17 0943 Oral     SpO2 06/30/17 0943 100 %     Weight --      Height --      Head Circumference --      Peak Flow --      Pain Score 06/30/17 0945 0     Pain Loc --      Pain Edu? --  Excl. in [place]? --    No data found.  Updated Vital Signs BP 130/65 (BP Location: Left Arm)   Pulse (!) 53   Temp 97.7 F (36.5 C) (Oral)   Resp 16   SpO2 100%   Visual Acuity Right Eye Distance:   Left Eye Distance:   Bilateral Distance:    Right Eye Near:   Left Eye Near:    Bilateral Near:     Physical Exam  Constitutional: He appears well-developed and well-nourished. No distress.  HENT:  Head: Normocephalic and atraumatic.  Right Ear: Tympanic membrane, external ear and ear canal normal.  Left Ear: Tympanic membrane, external ear and ear canal normal.  Nose: Right sinus exhibits maxillary sinus tenderness and frontal sinus tenderness. Left sinus exhibits maxillary sinus tenderness and frontal sinus tenderness.  Mouth/Throat: Uvula is midline, oropharynx is clear and moist and mucous membranes are normal. No oropharyngeal exudate or tonsillar abscesses.  Eyes: Conjunctivae and EOM are normal. Pupils are equal, round, and reactive to  light. Right eye exhibits no discharge. Left eye exhibits no discharge. No scleral icterus.  Neck: Normal range of motion. Neck supple. No tracheal deviation present. No thyromegaly present.  Cardiovascular: Normal rate, regular rhythm and normal heart sounds.  Pulmonary/Chest: Effort normal and breath sounds normal. No stridor. No respiratory distress. He has no wheezes. He has no rales. He exhibits no tenderness.  Lymphadenopathy:    He has no cervical adenopathy.  Neurological: He is alert.  Skin: Skin is warm and dry. No rash noted. He is not diaphoretic.  Nursing note and vitals reviewed.    UC Treatments / Results  Labs (all labs ordered are listed, but only abnormal results are displayed) Labs Reviewed - No data to display  EKG  EKG Interpretation None       Radiology No results found.  Procedures Procedures (including critical care time)  Medications Ordered in UC Medications - No data to display   Initial Impression / Assessment and Plan / UC Course  I have reviewed the triage vital signs and the nursing notes.  Pertinent labs & imaging results that were available during my care of the patient were reviewed by me and considered in my medical decision making (see chart for details).       Final Clinical Impressions(s) / UC Diagnoses   Final diagnoses:  Acute maxillary sinusitis, recurrence not specified    ED Discharge Orders        Ordered    chlorpheniramine-HYDROcodone (TUSSIONEX PENNKINETIC ER) 10-8 MG/5ML SUER  Every 12 hours PRN     06/30/17 1004    amoxicillin-clavulanate (AUGMENTIN) 875-125 MG tablet  Every 12 hours     06/30/17 1004     1. diagnosis reviewed with patient 2. rx as per orders above; reviewed possible side effects, interactions, risks and benefits  3. Recommend supportive treatment with otc analgesics prn, rest, fluids  4. Follow-up prn if symptoms worsen or don't improve  Controlled Substance Prescriptions  Controlled  Substance Registry consulted? Not Applicable   Norval Gable, MD 06/30/17 1010

## 2017-06-30 NOTE — ED Triage Notes (Signed)
Patient started having recurrent symptoms of cough chest congestion, and nasal congestion 4 days ago.

## 2017-07-16 DIAGNOSIS — H25012 Cortical age-related cataract, left eye: Secondary | ICD-10-CM | POA: Diagnosis not present

## 2017-07-16 DIAGNOSIS — H2512 Age-related nuclear cataract, left eye: Secondary | ICD-10-CM | POA: Diagnosis not present

## 2017-07-16 DIAGNOSIS — H25042 Posterior subcapsular polar age-related cataract, left eye: Secondary | ICD-10-CM | POA: Diagnosis not present

## 2017-07-24 DIAGNOSIS — H25812 Combined forms of age-related cataract, left eye: Secondary | ICD-10-CM | POA: Diagnosis not present

## 2017-07-24 DIAGNOSIS — H2512 Age-related nuclear cataract, left eye: Secondary | ICD-10-CM | POA: Diagnosis not present

## 2017-10-18 DIAGNOSIS — E7800 Pure hypercholesterolemia, unspecified: Secondary | ICD-10-CM | POA: Diagnosis not present

## 2017-10-18 DIAGNOSIS — I2510 Atherosclerotic heart disease of native coronary artery without angina pectoris: Secondary | ICD-10-CM | POA: Diagnosis not present

## 2017-10-18 DIAGNOSIS — I10 Essential (primary) hypertension: Secondary | ICD-10-CM | POA: Diagnosis not present

## 2018-01-07 DIAGNOSIS — M2041 Other hammer toe(s) (acquired), right foot: Secondary | ICD-10-CM | POA: Diagnosis not present

## 2018-01-07 DIAGNOSIS — B070 Plantar wart: Secondary | ICD-10-CM | POA: Diagnosis not present

## 2018-01-07 DIAGNOSIS — M2042 Other hammer toe(s) (acquired), left foot: Secondary | ICD-10-CM | POA: Diagnosis not present

## 2018-01-21 DIAGNOSIS — B070 Plantar wart: Secondary | ICD-10-CM | POA: Diagnosis not present

## 2018-01-21 DIAGNOSIS — M2042 Other hammer toe(s) (acquired), left foot: Secondary | ICD-10-CM | POA: Diagnosis not present

## 2018-01-21 DIAGNOSIS — M2041 Other hammer toe(s) (acquired), right foot: Secondary | ICD-10-CM | POA: Diagnosis not present

## 2018-02-01 DIAGNOSIS — M180 Bilateral primary osteoarthritis of first carpometacarpal joints: Secondary | ICD-10-CM | POA: Diagnosis not present

## 2018-02-01 DIAGNOSIS — G5603 Carpal tunnel syndrome, bilateral upper limbs: Secondary | ICD-10-CM | POA: Diagnosis not present

## 2018-02-22 DIAGNOSIS — G5603 Carpal tunnel syndrome, bilateral upper limbs: Secondary | ICD-10-CM | POA: Diagnosis not present

## 2018-03-29 DIAGNOSIS — G5603 Carpal tunnel syndrome, bilateral upper limbs: Secondary | ICD-10-CM | POA: Diagnosis not present

## 2018-04-04 DIAGNOSIS — I2510 Atherosclerotic heart disease of native coronary artery without angina pectoris: Secondary | ICD-10-CM | POA: Diagnosis not present

## 2018-04-04 DIAGNOSIS — E7800 Pure hypercholesterolemia, unspecified: Secondary | ICD-10-CM | POA: Diagnosis not present

## 2018-04-04 DIAGNOSIS — I10 Essential (primary) hypertension: Secondary | ICD-10-CM | POA: Diagnosis not present

## 2018-04-14 ENCOUNTER — Ambulatory Visit
Admission: EM | Admit: 2018-04-14 | Discharge: 2018-04-14 | Disposition: A | Discharge status: Home / Self Care | Payer: 59 | Attending: Emergency Medicine | Admitting: Emergency Medicine

## 2018-04-14 ENCOUNTER — Other Ambulatory Visit: Payer: Self-pay

## 2018-04-14 DIAGNOSIS — J069 Acute upper respiratory infection, unspecified: Secondary | ICD-10-CM

## 2018-04-14 MED ORDER — BENZONATATE 100 MG PO CAPS: 100.0000 mg | ORAL_CAPSULE | Freq: Three times a day (TID) | ORAL | 0 refills | Status: DC | PRN

## 2018-04-14 MED ORDER — HYDROCOD POLST-CPM POLST ER 10-8 MG/5ML PO SUER: 5.0000 mL | Freq: Every evening | ORAL | 0 refills | Status: DC | PRN

## 2018-04-14 MED ORDER — FLUTICASONE PROPIONATE 50 MCG/ACT NA SUSP: 1.0000 | Freq: Every day | NASAL | 0 refills | Status: DC

## 2018-04-14 NOTE — ED Provider Notes (Signed)
[facility] ____________________________________________  Time seen: Approximately 8:28 AM  I have reviewed the triage vital signs and the nursing notes.   HISTORY  Chief Complaint Nasal Congestion  HPI Richard Jensen is a 67 y.o. male presenting for evaluation of 3 days of runny nose, nasal congestion, cough and scratchy throat.  Denies fevers.  Has not tried any medication over-the-counter for the same complaints.  Continues to eat and drink well.  Reports sick contacts at work this past week with similar complaints.  Denies other aggravating or alleviating factors.  States cough does intermittently disrupt sleep.  Reports otherwise doing well.  Denies chest pain, shortness of breath, abdominal pain, recent sickness or recent antibiotic use.  Valera Castle, MD: PCP   Past Medical History:  Diagnosis Date  . GERD (gastroesophageal reflux disease)   . Hypercholesteremia   . Hypertension   . Myocardial infarct ([place])     There are no active problems to display for this patient.   Past Surgical History:  Procedure Laterality Date  . JOINT REPLACEMENT       No current facility-administered medications for this encounter.   Current Outpatient Medications:  .  amLODipine (NORVASC) 10 MG tablet, Take 10 mg by mouth daily., Disp: , Rfl:  .  atorvastatin (LIPITOR) 80 MG tablet, Take 80 mg by mouth daily., Disp: , Rfl:  .  benzonatate (TESSALON PERLES) 100 MG capsule, Take 1 capsule (100 mg total) by mouth 3 (three) times daily as needed for cough., Disp: 15 capsule, Rfl: 0 .  chlorpheniramine-HYDROcodone (TUSSIONEX PENNKINETIC ER) 10-8 MG/5ML SUER, Take 5 mLs by mouth at bedtime as needed for cough. do not drive or operate machinery while taking as can cause drowsiness., Disp: 50 mL, Rfl: 0 .  enalapril (VASOTEC) 20 MG tablet, Take 20 mg by mouth daily., Disp: , Rfl:  .  fluticasone (FLONASE) 50 MCG/ACT nasal spray, Place 1 spray into both nostrils daily., Disp:  1 g, Rfl: 0 .  naproxen (NAPROSYN) 500 MG tablet, Take 500 mg by mouth 2 (two) times daily with a meal., Disp: , Rfl:  .  ranitidine (ZANTAC) 150 MG capsule, Take 150 mg by mouth 2 (two) times daily., Disp: , Rfl:   Allergies Patient has no known allergies.  Family History  Problem Relation Age of Onset  . Irregular heart beat Mother   . Diabetes Father     Social History Social History   Tobacco Use  . Smoking status: Never Smoker  . Smokeless tobacco: Never Used  Substance Use Topics  . Alcohol use: Yes    Alcohol/week: 2.0 standard drinks    Types: 2 Cans of beer per week    Comment: beers per day  . Drug use: Never    Review of Systems Constitutional: No fever ENT:AS above.  Cardiovascular: Denies chest pain. Respiratory: Denies shortness of breath. Gastrointestinal: No abdominal pain. Musculoskeletal: Negative for back pain. Skin: Negative for rash.   ____________________________________________   PHYSICAL EXAM:  VITAL SIGNS: ED Triage Vitals  Enc Vitals Group     BP 04/14/18 0808 (!) 144/78     Pulse Rate 04/14/18 0808 76     Resp 04/14/18 0808 16     Temp 04/14/18 0808 98.2 F (36.8 C)     Temp Source 04/14/18 0808 Oral     SpO2 04/14/18 0808 99 %     Weight 04/14/18 0809 230 lb (104.3 kg)     Height 04/14/18 0809 5\' 11"  (1.803 m)  Head Circumference --      Peak Flow --      Pain Score 04/14/18 0809 8     Pain Loc --      Pain Edu? --      Excl. in [place]? --     Constitutional: Alert and oriented. Well appearing and in no acute distress. Eyes: Conjunctivae are normal.  Head: [email or web address] bilateral frontal and maxillary sinus tenderness to palpation.  No swelling. No erythema.  Ears: no erythema, normal TMs bilaterally.   Nose:Nasal congestion with clear rhinorrhea  Mouth/Throat: Mucous membranes are moist. No pharyngeal erythema. No tonsillar swelling or exudate.  Neck: No stridor.  No cervical spine tenderness to  palpation. Hematological/Lymphatic/Immunilogical: No cervical lymphadenopathy. Cardiovascular: Normal rate, regular rhythm. Grossly normal heart sounds.  Good peripheral circulation. Respiratory: Normal respiratory effort.  No retractions. No wheezes, rales or rhonchi. Good air movement.  Musculoskeletal: Ambulatory with steady gait.  Neurologic:  Normal speech and language. No gait instability. Skin:  Skin appears warm, dry and intact. No rash noted. Psychiatric: Mood and affect are normal. Speech and behavior are normal.  ___________________________________________   LABS (all labs ordered are listed, but only abnormal results are displayed)  Labs Reviewed - No data to display ____________________________________________   PROCEDURES Procedures   INITIAL IMPRESSION / ASSESSMENT AND PLAN / ED COURSE  Pertinent labs & imaging results that were available during my care of the patient were reviewed by me and considered in my medical decision making (see chart for details).  Well-appearing patient.  No acute distress.  Suspect viral upper respiratory infection.  Encourage rest, fluids, supportive care, Rx Flonase, PRN Tessalon Perles and PRN Tussionex.Discussed indication, risks and benefits of medications with patient.  Discussed follow up with Primary care physician this week as needed. Discussed follow up and return parameters including no resolution or any worsening concerns. Patient verbalized understanding and agreed to plan.   ____________________________________________   FINAL CLINICAL IMPRESSION(S) / ED DIAGNOSES  Final diagnoses:  Upper respiratory tract infection, unspecified type     ED Discharge Orders         Ordered    benzonatate (TESSALON PERLES) 100 MG capsule  3 times daily PRN     04/14/18 0820    chlorpheniramine-HYDROcodone (TUSSIONEX PENNKINETIC ER) 10-8 MG/5ML SUER  At bedtime PRN     04/14/18 0820    fluticasone (FLONASE) 50 MCG/ACT nasal spray   Daily     04/14/18 0820           Note: This dictation was prepared with Dragon dictation along with smaller phrase technology. Any transcriptional errors that result from this process are unintentional.         Marylene Land, NP 04/14/18 [phone]

## 2018-04-14 NOTE — ED Triage Notes (Signed)
Pt with past few days of nasal congestion, "runny eyes", scratchy throat, and headache

## 2018-04-14 NOTE — Discharge Instructions (Addendum)
Take medication as prescribed. Rest. Drink plenty of fluids.  ° °Follow up with your primary care physician this week as needed. Return to Urgent care for new or worsening concerns.  ° °

## 2018-04-15 DIAGNOSIS — Z01818 Encounter for other preprocedural examination: Secondary | ICD-10-CM | POA: Diagnosis not present

## 2018-04-15 DIAGNOSIS — G5601 Carpal tunnel syndrome, right upper limb: Secondary | ICD-10-CM | POA: Diagnosis not present

## 2018-04-18 DIAGNOSIS — K219 Gastro-esophageal reflux disease without esophagitis: Secondary | ICD-10-CM | POA: Diagnosis not present

## 2018-04-18 DIAGNOSIS — G5601 Carpal tunnel syndrome, right upper limb: Secondary | ICD-10-CM | POA: Diagnosis not present

## 2018-04-18 DIAGNOSIS — M19019 Primary osteoarthritis, unspecified shoulder: Secondary | ICD-10-CM | POA: Diagnosis not present

## 2018-04-23 DIAGNOSIS — G4733 Obstructive sleep apnea (adult) (pediatric): Secondary | ICD-10-CM | POA: Diagnosis not present

## 2018-05-09 DIAGNOSIS — I2510 Atherosclerotic heart disease of native coronary artery without angina pectoris: Secondary | ICD-10-CM | POA: Diagnosis not present

## 2018-05-17 DIAGNOSIS — G5602 Carpal tunnel syndrome, left upper limb: Secondary | ICD-10-CM | POA: Diagnosis not present

## 2018-05-17 DIAGNOSIS — Z01818 Encounter for other preprocedural examination: Secondary | ICD-10-CM | POA: Diagnosis not present

## 2018-05-23 DIAGNOSIS — I2510 Atherosclerotic heart disease of native coronary artery without angina pectoris: Secondary | ICD-10-CM | POA: Diagnosis not present

## 2018-05-23 DIAGNOSIS — G5602 Carpal tunnel syndrome, left upper limb: Secondary | ICD-10-CM | POA: Diagnosis not present

## 2018-05-23 DIAGNOSIS — I252 Old myocardial infarction: Secondary | ICD-10-CM | POA: Diagnosis not present

## 2018-06-23 ENCOUNTER — Ambulatory Visit
Admission: EM | Admit: 2018-06-23 | Discharge: 2018-06-23 | Disposition: A | Discharge status: Home / Self Care | Payer: 59 | Attending: Family Medicine | Admitting: Family Medicine

## 2018-06-23 DIAGNOSIS — J0100 Acute maxillary sinusitis, unspecified: Secondary | ICD-10-CM | POA: Diagnosis not present

## 2018-06-23 MED ORDER — AMOXICILLIN-POT CLAVULANATE 875-125 MG PO TABS: 1.0000 | ORAL_TABLET | Freq: Two times a day (BID) | ORAL | 0 refills | Status: DC

## 2018-06-23 NOTE — ED Provider Notes (Signed)
[facility]    CSN: [id number] Arrival date & time: 06/23/18  9629  History   Chief Complaint Chief Complaint  Patient presents with  . Sinusitis   HPI  67 year old male presents with concerns for sinusitis.  Patient reports he has been sick for the past week.  Reports sinus drainage, ear pain/popping, sore throat, sinus pressure and pain, and congestion.  Also reports productive cough.  He has been taking over-the-counter medication without improvement.  No fever.  No known exacerbating factors.  No other associated symptoms.  No other complaints.  PMH, Surgical Hx, Family Hx, Social History reviewed and updated as below.  Past Medical History:  Diagnosis Date  . GERD (gastroesophageal reflux disease)   . Hypercholesteremia   . Hypertension   . Myocardial infarct [facility])    Past Surgical History:  Procedure Laterality Date  . JOINT REPLACEMENT     Home Medications    Prior to Admission medications   Medication Sig Start Date End Date Taking? Authorizing Provider  amLODipine (NORVASC) 10 MG tablet Take 10 mg by mouth daily.   Yes [provider]  atorvastatin (LIPITOR) 80 MG tablet Take 80 mg by mouth daily.   Yes [provider]  enalapril (VASOTEC) 20 MG tablet Take 20 mg by mouth daily.   Yes [provider]  fluticasone (FLONASE) 50 MCG/ACT nasal spray Place 1 spray into both nostrils daily. 04/14/18  Yes Marylene Land, NP  naproxen (NAPROSYN) 500 MG tablet Take 500 mg by mouth 2 (two) times daily with a meal.   Yes [provider]  amoxicillin-clavulanate (AUGMENTIN) 875-125 MG tablet Take 1 tablet by mouth every 12 (twelve) hours. 06/23/18   Coral Spikes, DO  benzonatate (TESSALON PERLES) 100 MG capsule Take 1 capsule (100 mg total) by mouth 3 (three) times daily as needed for cough. 04/14/18   Marylene Land, NP  chlorpheniramine-HYDROcodone [facility] PENNKINETIC ER) 10-8 MG/5ML SUER Take 5 mLs by mouth at bedtime as  needed for cough. do not drive or operate machinery while taking as can cause drowsiness. 04/14/18   Marylene Land, NP  ranitidine (ZANTAC) 150 MG capsule Take 150 mg by mouth 2 (two) times daily.    [provider]    Family History Family History  Problem Relation Age of Onset  . Irregular heart beat Mother   . Diabetes Father     Social History Social History   Tobacco Use  . Smoking status: Never Smoker  . Smokeless tobacco: Never Used  Substance Use Topics  . Alcohol use: Yes    Alcohol/week: 2.0 standard drinks    Types: 2 Cans of beer per week    Comment: beers per day  . Drug use: Never     Allergies   Patient has no known allergies.   Review of Systems Review of Systems  Constitutional: Negative for fever.  HENT: Positive for congestion, ear pain, sinus pressure, sinus pain and sore throat.   Respiratory: Positive for cough.    Physical Exam Triage Vital Signs ED Triage Vitals  Enc Vitals Group     BP 06/23/18 0833 (!) 142/65     Pulse Rate 06/23/18 0833 74     Resp 06/23/18 0833 18     Temp 06/23/18 0833 98.1 F (36.7 C)     Temp Source 06/23/18 0833 Oral     SpO2 06/23/18 0833 97 %     Weight 06/23/18 0835 230 lb (104.3 kg)     Height  06/23/18 0835 5\' 11"  (1.803 m)     Head Circumference --      Peak Flow --      Pain Score 06/23/18 0835 4     Pain Loc --      Pain Edu? --      Excl. in [place]? --    Updated Vital Signs BP (!) 142/65 (BP Location: Right Arm)   Pulse 74   Temp 98.1 F (36.7 C) (Oral)   Resp 18   Ht 5\' 11"  (1.803 m)   Wt 104.3 kg   SpO2 97%   BMI 32.08 kg/m   Visual Acuity Right Eye Distance:   Left Eye Distance:   Bilateral Distance:    Right Eye Near:   Left Eye Near:    Bilateral Near:     Physical Exam Vitals signs and nursing note reviewed.  Constitutional:      General: He is not in acute distress.    Appearance: He is not ill-appearing.  HENT:     Head: Normocephalic and atraumatic.     Right  Ear: Tympanic membrane normal.     Left Ear: Tympanic membrane normal.     Nose:     Right Sinus: Maxillary sinus tenderness present.     Left Sinus: Maxillary sinus tenderness present.     Mouth/Throat:     Comments: Oropharynx with moderate erythema. Cardiovascular:     Rate and Rhythm: Normal rate and regular rhythm.  Pulmonary:     Effort: Pulmonary effort is normal.     Breath sounds: Normal breath sounds. No wheezing, rhonchi or rales.  Neurological:     Mental Status: He is alert.  Psychiatric:        Mood and Affect: Mood normal.        Behavior: Behavior normal.    UC Treatments / Results  Labs (all labs ordered are listed, but only abnormal results are displayed) Labs Reviewed - No data to display  EKG None  Radiology No results found.  Procedures Procedures (including critical care time)  Medications Ordered in UC Medications - No data to display  Initial Impression / Assessment and Plan / UC Course  I have reviewed the triage vital signs and the nursing notes.  Pertinent labs & imaging results that were available during my care of the patient were reviewed by me and considered in my medical decision making (see chart for details).    67 year old male presents with sinusitis.  Treating with Augmentin.  Final Clinical Impressions(s) / UC Diagnoses   Final diagnoses:  Acute maxillary sinusitis, recurrence not specified     Discharge Instructions     Antibiotic as prescribed.  Take care  Dr. Lacinda Axon    ED Prescriptions    Medication Sig Dispense Auth. Provider   amoxicillin-clavulanate (AUGMENTIN) 875-125 MG tablet Take 1 tablet by mouth every 12 (twelve) hours. 14 tablet Coral Spikes, DO     Controlled Substance Prescriptions [place] Controlled Substance Registry consulted? Not Applicable   Coral Spikes, DO 06/23/18 0900

## 2018-06-23 NOTE — Discharge Instructions (Signed)
Antibiotic as prescribed.  Take care  Dr. Angelena Sand  

## 2018-06-23 NOTE — ED Triage Notes (Signed)
Pt states for 1 week he has had ear popping when he chews, sinus drainage into his throat irritating his throat, nasal congestion. Has been taking otc medication without relief. No fever reported.

## 2018-07-02 DIAGNOSIS — M19011 Primary osteoarthritis, right shoulder: Secondary | ICD-10-CM | POA: Diagnosis not present

## 2018-08-26 DIAGNOSIS — L408 Other psoriasis: Secondary | ICD-10-CM | POA: Diagnosis not present

## 2018-08-26 DIAGNOSIS — D225 Melanocytic nevi of trunk: Secondary | ICD-10-CM | POA: Diagnosis not present

## 2018-08-26 DIAGNOSIS — L570 Actinic keratosis: Secondary | ICD-10-CM | POA: Diagnosis not present

## 2018-08-26 DIAGNOSIS — D2262 Melanocytic nevi of left upper limb, including shoulder: Secondary | ICD-10-CM | POA: Diagnosis not present

## 2019-10-24 ENCOUNTER — Ambulatory Visit: Payer: 59 | Attending: Internal Medicine

## 2019-10-24 DIAGNOSIS — Z23 Encounter for immunization: Secondary | ICD-10-CM

## 2019-10-24 NOTE — Progress Notes (Signed)
   Covid-19 Vaccination Clinic  Name:  Daaiel Beardmore    MRN: [id number] DOB: 1951/06/21  10/24/2019  Mr. Difranco was observed post Covid-19 immunization for 15 minutes without incident. He was provided with Vaccine Information Sheet and instruction to access the V-Safe system.   Mr. Gozman was instructed to call 911 with any severe reactions post vaccine: Marland Kitchen Difficulty breathing  . Swelling of face and throat  . A fast heartbeat  . A bad rash all over body  . Dizziness and weakness   Immunizations Administered    Name Date Dose VIS Date Route   Moderna COVID-19 Vaccine 10/24/2019  3:13 PM 0.5 mL 06/2019 Intramuscular   Manufacturer: Moderna   Lot: [id number]   [place]:[id number]

## 2020-07-06 ENCOUNTER — Ambulatory Visit: Payer: Self-pay

## 2020-07-07 ENCOUNTER — Other Ambulatory Visit: Payer: Self-pay

## 2020-07-07 ENCOUNTER — Ambulatory Visit
Admission: EM | Admit: 2020-07-07 | Discharge: 2020-07-07 | Disposition: A | Discharge status: Home / Self Care | Payer: 59 | Attending: Sports Medicine | Admitting: Sports Medicine

## 2020-07-07 DIAGNOSIS — R0981 Nasal congestion: Secondary | ICD-10-CM | POA: Diagnosis not present

## 2020-07-07 DIAGNOSIS — Z20822 Contact with and (suspected) exposure to covid-19: Secondary | ICD-10-CM | POA: Diagnosis not present

## 2020-07-07 DIAGNOSIS — J209 Acute bronchitis, unspecified: Secondary | ICD-10-CM | POA: Diagnosis present

## 2020-07-07 DIAGNOSIS — R059 Cough, unspecified: Secondary | ICD-10-CM | POA: Insufficient documentation

## 2020-07-07 DIAGNOSIS — J069 Acute upper respiratory infection, unspecified: Secondary | ICD-10-CM | POA: Diagnosis present

## 2020-07-07 MED ORDER — AZITHROMYCIN 250 MG PO TABS: 250.0000 mg | ORAL_TABLET | Freq: Every day | ORAL | 0 refills | Status: DC

## 2020-07-07 MED ORDER — ALBUTEROL SULFATE HFA 108 (90 BASE) MCG/ACT IN AERS: 1.0000 | INHALATION_SPRAY | Freq: Four times a day (QID) | RESPIRATORY_TRACT | 0 refills | Status: DC | PRN

## 2020-07-07 MED ORDER — BENZONATATE 100 MG PO CAPS: 100.0000 mg | ORAL_CAPSULE | Freq: Three times a day (TID) | ORAL | 0 refills | Status: DC

## 2020-07-07 NOTE — ED Triage Notes (Addendum)
Patient states that he is having nasal congestion and body aches, sinus pain and pressure x 4 days. Patient did have negative at home test.

## 2020-07-07 NOTE — Discharge Instructions (Addendum)
We will go ahead and get a Covid test.  We do not have any [place].  We will send it to the hospital.  He needs to assume that he is positive until he has a documented negative test.  Someone from our office will contact him only if he is positive.  He needs to check my chart for negative test. I am going to go ahead and cover him for atypical pneumonia pending his Covid test.  Give him a Z-Pak. Given that he is having a cough I will go ahead and give him albuterol MDI.  Also add in [employer]. Recommended over-the-counter Mucinex 1200 mg twice daily. He can use over-the-counter cough syrup as long as he does not use the DM component of Mucinex. I gave him a work note keeping him out of work until January 10.  If he is positive this will need to be extended.  He needs plenty of rest, plenty of fluids.  Tylenol Motrin for fever discomfort. Red flag signs and symptoms were discussed in detail.  Certainly if he develops chest pain or significant shortness of breath he needs to call 911 or go directly to the nearest emergency room. He is welcome to see [place] back in the future but for now we will discharge him care and to follow-up with [place] as needed.

## 2020-07-07 NOTE — ED Provider Notes (Signed)
[facility]    CSN: [id number] Arrival date & time: 07/07/20  1253      History   Chief Complaint Chief Complaint  Patient presents with  . Nasal Congestion    [phone]    HPI Richard Jensen is a 70 y.o. male.   Pleasant 70 year old male who presents for evaluation of 1 week of URI symptoms.  Started with some sinus drainage and a sore throat.  Is progressed to cough and congestion.  He denies any Covid exposure history.  He has been vaccinated against Covid x2.  No influenza vaccine.  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  Denies chest pain shortness of breath abdominal pain nausea vomiting or diarrhea or urinary symptoms.  No significant red flag signs or symptoms elicited on history.     Past Medical History:  Diagnosis Date  . GERD (gastroesophageal reflux disease)   . Hypercholesteremia   . Hypertension   . Myocardial infarct (HCC)     There are no problems to display for this patient.   Past Surgical History:  Procedure Laterality Date  . JOINT REPLACEMENT         Home Medications    Prior to Admission medications   Medication Sig Start Date End Date Taking? Authorizing Provider  albuterol (VENTOLIN HFA) 108 (90 Base) MCG/ACT inhaler Inhale 1-2 puffs into the lungs every 6 (six) hours as needed for wheezing or shortness of breath. 07/07/20  Yes Verda Cumins, MD  amLODipine (NORVASC) 10 MG tablet Take 10 mg by mouth daily.   Yes [provider]  atorvastatin (LIPITOR) 80 MG tablet Take 80 mg by mouth daily.   Yes [provider]  azithromycin (ZITHROMAX) 250 MG tablet Take 1 tablet (250 mg total) by mouth daily. Take first 2 tablets together, then 1 every day until finished. 07/07/20  Yes Verda Cumins, MD  benzonatate (TESSALON) 100 MG capsule Take 1 capsule (100 mg total) by mouth every 8 (eight) hours. 07/07/20  Yes Verda Cumins, MD  enalapril (VASOTEC) 20 MG tablet Take 20 mg by mouth daily.   Yes  [provider]  naproxen (NAPROSYN) 500 MG tablet Take 500 mg by mouth 2 (two) times daily with a meal.   Yes [provider]  ranitidine (ZANTAC) 150 MG capsule Take 150 mg by mouth 2 (two) times daily.   Yes [provider]  amoxicillin-clavulanate (AUGMENTIN) 875-125 MG tablet Take 1 tablet by mouth every 12 (twelve) hours. 06/23/18   Coral Spikes, DO  chlorpheniramine-HYDROcodone (TUSSIONEX PENNKINETIC ER) 10-8 MG/5ML SUER Take 5 mLs by mouth at bedtime as needed for cough. do not drive or operate machinery while taking as can cause drowsiness. 04/14/18   Marylene Land, NP  fluticasone (FLONASE) 50 MCG/ACT nasal spray Place 1 spray into both nostrils daily. 04/14/18   Marylene Land, NP    Family History Family History  Problem Relation Age of Onset  . Irregular heart beat Mother   . Diabetes Father     Social History Social History   Tobacco Use  . Smoking status: Never Smoker  . Smokeless tobacco: Never Used  Vaping Use  . Vaping Use: Never used  Substance Use Topics  . Alcohol use: Yes    Alcohol/week: 2.0 standard drinks    Types: 2 Cans of beer per week    Comment: beers per day  . Drug use: Never     Allergies   Patient has no known  allergies.   Review of Systems Review of Systems  Constitutional: Negative for activity change, appetite change, chills, diaphoresis and fever.  HENT: Positive for congestion, postnasal drip, rhinorrhea, sinus pressure and sore throat. Negative for ear pain and sinus pain.   Eyes: Negative for pain.  Respiratory: Positive for cough. Negative for apnea, chest tightness, shortness of breath, wheezing and stridor.   Cardiovascular: Negative for chest pain and palpitations.  Gastrointestinal: Negative for abdominal pain.  Genitourinary: Negative for dysuria.  Skin: Negative for color change, pallor, rash and wound.  Neurological: Negative for dizziness, syncope, light-headedness, numbness and headaches.   All other systems reviewed and are negative.    Physical Exam Triage Vital Signs ED Triage Vitals  Enc Vitals Group     BP 07/07/20 1533 (!) 148/69     Pulse Rate 07/07/20 1533 63     Resp 07/07/20 1533 18     Temp 07/07/20 1533 98.4 F (36.9 C)     Temp Source 07/07/20 1533 Oral     SpO2 07/07/20 1533 99 %     Weight 07/07/20 1341 225 lb (102.1 kg)     Height 07/07/20 1341 5\' 11"  (1.803 m)     Head Circumference --      Peak Flow --      Pain Score 07/07/20 1340 8     Pain Loc --      Pain Edu? --      Excl. in [place]? --    No data found.  Updated Vital Signs BP (!) 148/69 (BP Location: Right Arm)   Pulse 63   Temp 98.4 F (36.9 C) (Oral)   Resp 18   Ht 5\' 11"  (1.803 m)   Wt 102.1 kg   SpO2 99%   BMI 31.38 kg/m   Visual Acuity Right Eye Distance:   Left Eye Distance:   Bilateral Distance:    Right Eye Near:   Left Eye Near:    Bilateral Near:     Physical Exam Vitals and nursing note reviewed.  Constitutional:      General: He is not in acute distress.    Appearance: Normal appearance. He is not ill-appearing, toxic-appearing or diaphoretic.  HENT:     Head: Normocephalic and atraumatic.     Right Ear: Tympanic membrane normal.     Left Ear: Tympanic membrane normal.     Nose: Congestion present. No rhinorrhea.     Mouth/Throat:     Mouth: Mucous membranes are moist.     Pharynx: Posterior oropharyngeal erythema present. No oropharyngeal exudate.  Eyes:     Extraocular Movements: Extraocular movements intact.     Pupils: Pupils are equal, round, and reactive to light.  Cardiovascular:     Rate and Rhythm: Normal rate and regular rhythm.     Pulses: Normal pulses.     Heart sounds: Murmur heard.  No friction rub. No gallop.      Comments: 1 out of 6 systolic ejection murmur Pulmonary:     Effort: Pulmonary effort is normal. No respiratory distress.     Breath sounds: Normal breath sounds. No stridor. No wheezing, rhonchi or rales.     Comments:  Patient is coughing throughout auscultation in the entire examination. Musculoskeletal:     Cervical back: Normal range of motion. No rigidity.  Lymphadenopathy:     Cervical: Cervical adenopathy present.  Skin:    General: Skin is warm and dry.     Capillary Refill: Capillary refill takes less  than 2 seconds.  Neurological:     General: No focal deficit present.     Mental Status: He is alert.      UC Treatments / Results  Labs (all labs ordered are listed, but only abnormal results are displayed) Labs Reviewed  SARS CORONAVIRUS 2 (TAT 6-24 HRS)    EKG   Radiology No results found.  Procedures Procedures (including critical care time)  Medications Ordered in UC Medications - No data to display  Initial Impression / Assessment and Plan / UC Course  I have reviewed the triage vital signs and the nursing notes.  Pertinent labs & imaging results that were available during my care of the patient were reviewed by me and considered in my medical decision making (see chart for details).   Clinical impression: 70 year old with 1 week of URI symptoms.  He has got a fairly significant cough with congestion.  He does have a cardiac history with an MI and a stroke back in 2002.  Fairly healthy with just hypertension.  Treatment plan: 1.  The findings and treatment plan were discussed in detail with the patient.  Patient was in agreement. 2.  We will go ahead and get a Covid test.  We do not have any rap[place].  We will send it to the hospital.  He needs to assume that he is positive until he has a documented negative test.  Someone from our office will contact him only if he is positive.  He needs to check my chart for negative test. 3.  I am going to go ahead and cover him for atypical pneumonia pending his Covid test.  Give him a Z-Pak. 4.  Given that he is having a cough I will go ahead and give him albuterol MDI.  Also add in [employer]. 5.  Recommended over-the-counter  Mucinex 1200 mg twice daily. 6.  He can use over-the-counter cough syrup as long as he does not use the DM component of Mucinex. 7.  I gave him a work note keeping him out of work until January 10.  If he is positive this will need to be extended.  He needs plenty of rest, plenty of fluids.  Tylenol Motrin for fever discomfort. 8.  Red flag signs and symptoms were discussed in detail.  Certainly if he develops chest pain or significant shortness of breath he needs to call 911 or go directly to the nearest emergency room. 9.  He is welcome to see [place] back in the future but for now we will discharge him care and to follow-up with [place] as needed.      Final Clinical Impressions(s) / UC Diagnoses   Final diagnoses:  Viral URI with cough  Acute bronchitis, unspecified organism  Cough  Nasal congestion     Discharge Instructions     We will go ahead and get a Covid test.  We do not have any rapids.  We will send it to the hospital.  He needs to assume that he is positive until he has a documented negative test.  Someone from our office will contact him only if he is positive.  He needs to check my chart for negative test. I am going to go ahead and cover him for atypical pneumonia pending his Covid test.  Give him a Z-Pak. Given that he is having a cough I will go ahead and give him albuterol MDI.  Also add in [employer]. Recommended over-the-counter Mucinex 1200 mg twice daily. He  can use over-the-counter cough syrup as long as he does not use the DM component of Mucinex. I gave him a work note keeping him out of work until January 10.  If he is positive this will need to be extended.  He needs plenty of rest, plenty of fluids.  Tylenol Motrin for fever discomfort. Red flag signs and symptoms were discussed in detail.  Certainly if he develops chest pain or significant shortness of breath he needs to call 911 or go directly to the nearest emergency room. He is welcome to see [place] back in the  future but for now we will discharge him care and to follow-up with [place] as needed.    ED Prescriptions    Medication Sig Dispense Auth. Provider   azithromycin (ZITHROMAX) 250 MG tablet Take 1 tablet (250 mg total) by mouth daily. Take first 2 tablets together, then 1 every day until finished. 6 tablet Verda Cumins, MD   albuterol (VENTOLIN HFA) 108 (90 Base) MCG/ACT inhaler Inhale 1-2 puffs into the lungs every 6 (six) hours as needed for wheezing or shortness of breath. 1 each Verda Cumins, MD   benzonatate (TESSALON) 100 MG capsule Take 1 capsule (100 mg total) by mouth every 8 (eight) hours. 21 capsule Verda Cumins, MD     PDMP not reviewed this encounter.   Verda Cumins, MD 07/07/20 1659

## 2020-07-08 LAB — SARS CORONAVIRUS 2 (TAT 6-24 HRS): SARS Coronavirus 2: NEGATIVE

## 2020-08-10 ENCOUNTER — Other Ambulatory Visit: Payer: Self-pay

## 2020-08-10 ENCOUNTER — Ambulatory Visit
Admission: RE | Admit: 2020-08-10 | Discharge: 2020-08-10 | Disposition: A | Discharge status: Home / Self Care | Payer: 59 | Source: Ambulatory Visit | Attending: Emergency Medicine | Admitting: Emergency Medicine

## 2020-08-10 VITALS — BP 152/75 | HR 56 | Temp 98.1°F | Resp 18 | Ht 71.0 in | Wt 225.1 lb

## 2020-08-10 DIAGNOSIS — R0981 Nasal congestion: Secondary | ICD-10-CM | POA: Diagnosis not present

## 2020-08-10 MED ORDER — IPRATROPIUM BROMIDE 0.06 % NA SOLN: 2.0000 | Freq: Four times a day (QID) | NASAL | 12 refills | Status: DC

## 2020-08-10 MED ORDER — PROMETHAZINE-DM 6.25-15 MG/5ML PO SYRP: 5.0000 mL | ORAL_SOLUTION | Freq: Four times a day (QID) | ORAL | 0 refills | Status: DC | PRN

## 2020-08-10 MED ORDER — BENZONATATE 100 MG PO CAPS: 200.0000 mg | ORAL_CAPSULE | Freq: Three times a day (TID) | ORAL | 0 refills | Status: DC

## 2020-08-10 NOTE — Discharge Instructions (Addendum)
Perform sinus irrigation twice daily with a [employer] sinus rinse kit and distilled water.  Do not use tap water.  Take the [facility] every 8 hours during the day as needed for cough.  Take them with a small sip of water.  They may give you some numbness to the base of your tongue or metallic taste in her mouth, this is normal.  Use the Promethazine DM cough syrup at bedtime as needed for cough and congestion as it would make you drowsy.  Use the Atrovent nasal spray, 2 sprays in each nostril every 6 hours, as needed for nasal congestion.  If your symptoms continue, or worsen, return for reevaluation or see your primary care provider.

## 2020-08-10 NOTE — ED Provider Notes (Signed)
[facility]    CSN: [id number] Arrival date & time: 08/10/20  1349      History   Chief Complaint Chief Complaint  Patient presents with  . Nasal Congestion  . Cough    HPI Richard Jensen is a 70 y.o. male.   HPI   70 year old male here for evaluation of nasal congestion x1 month.  Patient was evaluated in this urgent care on 07/07/2020 and was diagnosed with a viral URI with cough.  He was treated at that time with a Z-Pak for atypical pneumonia.  At that time was negative.  Patient reports that he is continue to have his nasal congestion and a mild cough.  Patient denies runny nose or nasal discharge, shortness of breath or wheezing, or fever.  Patient does report that he has a little postnasal drip that mostly happens at night and some mild ear pressure.  Patient does report that he has some mild nasal discharge first thing in the morning only.  Past Medical History:  Diagnosis Date  . GERD (gastroesophageal reflux disease)   . Hypercholesteremia   . Hypertension   . Myocardial infarct (HCC)     There are no problems to display for this patient.   Past Surgical History:  Procedure Laterality Date  . JOINT REPLACEMENT         Home Medications    Prior to Admission medications   Medication Sig Start Date End Date Taking? Authorizing Provider  albuterol (VENTOLIN HFA) 108 (90 Base) MCG/ACT inhaler Inhale 1-2 puffs into the lungs every 6 (six) hours as needed for wheezing or shortness of breath. 07/07/20  Yes Verda Cumins, MD  amLODipine (NORVASC) 10 MG tablet Take 10 mg by mouth daily.   Yes [provider]  atorvastatin (LIPITOR) 80 MG tablet Take 80 mg by mouth daily.   Yes [provider]  benzonatate (TESSALON) 100 MG capsule Take 2 capsules (200 mg total) by mouth every 8 (eight) hours. 08/10/20  Yes Margarette Canada, NP  enalapril (VASOTEC) 20 MG tablet Take 20 mg by mouth daily.   Yes [provider]  ipratropium (ATROVENT)  0.06 % nasal spray Place 2 sprays into both nostrils 4 (four) times daily. 08/10/20  Yes Margarette Canada, NP  naproxen (NAPROSYN) 500 MG tablet Take 500 mg by mouth 2 (two) times daily with a meal.   Yes [provider]  promethazine-dextromethorphan (PROMETHAZINE-DM) 6.25-15 MG/5ML syrup Take 5 mLs by mouth 4 (four) times daily as needed. 08/10/20  Yes Margarette Canada, NP  ranitidine (ZANTAC) 150 MG capsule Take 150 mg by mouth 2 (two) times daily.   Yes [provider]  fluticasone (FLONASE) 50 MCG/ACT nasal spray Place 1 spray into both nostrils daily. 04/14/18 08/10/20  Marylene Land, NP    Family History Family History  Problem Relation Age of Onset  . Irregular heart beat Mother   . Diabetes Father     Social History Social History   Tobacco Use  . Smoking status: Never Smoker  . Smokeless tobacco: Never Used  Vaping Use  . Vaping Use: Never used  Substance Use Topics  . Alcohol use: Yes    Alcohol/week: 2.0 standard drinks    Types: 2 Cans of beer per week    Comment: beers per day  . Drug use: Never     Allergies   Patient has no known allergies.   Review of Systems Review of Systems  Constitutional: Negative for activity change, appetite change and fever.  HENT: Positive for congestion. Negative for ear pain, rhinorrhea and sore throat.   Respiratory: Positive for cough. Negative for shortness of breath and wheezing.   Musculoskeletal: Negative for arthralgias and myalgias.  Skin: Negative for rash.  Hematological: Negative.   Psychiatric/Behavioral: Negative.      Physical Exam Triage Vital Signs ED Triage Vitals  Enc Vitals Group     BP 08/10/20 1407 (!) 152/75     Pulse Rate 08/10/20 1407 (!) 56     Resp 08/10/20 1407 18     Temp 08/10/20 1407 98.1 F (36.7 C)     Temp Source 08/10/20 1407 Oral     SpO2 08/10/20 1407 100 %     Weight 08/10/20 1405 225 lb 1.4 oz (102.1 kg)     Height 08/10/20 1405 $RemoveBefor'5\' 11"'srqbtvTfLwkv$  (1.803 m)     Head Circumference  --      Peak Flow --      Pain Score 08/10/20 1405 0     Pain Loc --      Pain Edu? --      Excl. in [place]? --    No data found.  Updated Vital Signs BP (!) 152/75 (BP Location: Left Arm)   Pulse (!) 56   Temp 98.1 F (36.7 C) (Oral)   Resp 18   Ht $R'5\' 11"'Tf$  (1.803 m)   Wt 225 lb 1.4 oz (102.1 kg)   SpO2 100%   BMI 31.39 kg/m   Visual Acuity Right Eye Distance:   Left Eye Distance:   Bilateral Distance:    Right Eye Near:   Left Eye Near:    Bilateral Near:     Physical Exam Vitals and nursing note reviewed.  Constitutional:      General: He is not in acute distress.    Appearance: Normal appearance. He is not ill-appearing.  HENT:     Head: Normocephalic and atraumatic.     Right Ear: Tympanic membrane, ear canal and external ear normal.     Left Ear: Tympanic membrane, ear canal and external ear normal.     Nose: No congestion or rhinorrhea.     Comments: Nasal mucosa is mildly erythematous without edema or discharge.  Patient has mild tenderness to percussion of his maxillary sinuses bilaterally.    Mouth/Throat:     Mouth: Mucous membranes are moist.     Pharynx: Oropharynx is clear. No posterior oropharyngeal erythema.  Cardiovascular:     Rate and Rhythm: Normal rate and regular rhythm.     Pulses: Normal pulses.     Heart sounds: Normal heart sounds. No murmur heard. No gallop.   Pulmonary:     Effort: Pulmonary effort is normal.     Breath sounds: Normal breath sounds. No wheezing, rhonchi or rales.  Musculoskeletal:     Cervical back: Normal range of motion and neck supple.  Lymphadenopathy:     Cervical: No cervical adenopathy.  Skin:    General: Skin is warm and dry.     Capillary Refill: Capillary refill takes less than 2 seconds.     Findings: No erythema or rash.  Neurological:     General: No focal deficit present.     Mental Status: He is alert and oriented to person, place, and time.  Psychiatric:        Mood and Affect: Mood normal.         Behavior: Behavior normal.        Thought Content: Thought content normal.  Judgment: Judgment normal.      UC Treatments / Results  Labs (all labs ordered are listed, but only abnormal results are displayed) Labs Reviewed - No data to display  EKG   Radiology No results found.  Procedures Procedures (including critical care time)  Medications Ordered in UC Medications - No data to display  Initial Impression / Assessment and Plan / UC Course  I have reviewed the triage vital signs and the nursing notes.  Pertinent labs & imaging results that were available during my care of the patient were reviewed by me and considered in my medical decision making (see chart for details).   Patient presents for evaluation of nasal congestion that he reports is going on for over a month.  Patient's physical exam reveals mildly erythematous nasal mucosa without edema or discharge.  Posterior oropharynx is pink and moist without postnasal drip or injection.  No cervical lymphadenopathy.  Lung sounds clear to auscultation all fields.  Patient is aware that he has allergies and he says he is unsure.  There is no pus draining from his sinuses and he is not reporting any copious discharge throughout the day.  We will treat patient with Atrovent nasal spray for his congestion, Tessalon Perles and Promethazine DM for his congestion and cough, have him perform sinus irrigation twice daily, and follow-up with his primary care provider for new or continued symptoms.   Final Clinical Impressions(s) / UC Diagnoses   Final diagnoses:  Nasal congestion     Discharge Instructions     Perform sinus irrigation twice daily with a [employer] sinus rinse kit and distilled water.  Do not use tap water.  Take the [facility] every 8 hours during the day as needed for cough.  Take them with a small sip of water.  They may give you some numbness to the base of your tongue or metallic taste in her mouth,  this is normal.  Use the Promethazine DM cough syrup at bedtime as needed for cough and congestion as it would make you drowsy.  Use the Atrovent nasal spray, 2 sprays in each nostril every 6 hours, as needed for nasal congestion.  If your symptoms continue, or worsen, return for reevaluation or see your primary care provider.    ED Prescriptions    Medication Sig Dispense Auth. Provider   ipratropium (ATROVENT) 0.06 % nasal spray Place 2 sprays into both nostrils 4 (four) times daily. 15 mL Margarette Canada, NP   benzonatate (TESSALON) 100 MG capsule Take 2 capsules (200 mg total) by mouth every 8 (eight) hours. 21 capsule Margarette Canada, NP   promethazine-dextromethorphan (PROMETHAZINE-DM) 6.25-15 MG/5ML syrup Take 5 mLs by mouth 4 (four) times daily as needed. 118 mL Margarette Canada, NP     PDMP not reviewed this encounter.   Margarette Canada, NP 08/10/20 1429

## 2020-08-10 NOTE — ED Triage Notes (Addendum)
Patient c/o nasal congestion that has been going on  X 1 month. He was given a z-pak at that time but states his symptoms improved but then came back. He does report a mild cough. Patient declines COVID testing.

## 2020-08-16 ENCOUNTER — Ambulatory Visit (HOSPITAL_COMMUNITY)
Admission: RE | Admit: 2020-08-16 | Discharge: 2020-08-16 | Disposition: A | Discharge status: Home / Self Care | Payer: 59 | Source: Ambulatory Visit | Attending: Pulmonary Disease | Admitting: Pulmonary Disease

## 2020-08-16 ENCOUNTER — Other Ambulatory Visit (HOSPITAL_COMMUNITY): Payer: Self-pay | Admitting: Adult Health

## 2020-08-16 DIAGNOSIS — U071 COVID-19: Secondary | ICD-10-CM | POA: Insufficient documentation

## 2020-08-16 MED ORDER — SODIUM CHLORIDE 0.9 % IV SOLN: INTRAVENOUS | Status: DC | PRN

## 2020-08-16 MED ORDER — METHYLPREDNISOLONE SODIUM SUCC 125 MG IJ SOLR: 125.0000 mg | Freq: Once | INTRAMUSCULAR | Status: DC | PRN

## 2020-08-16 MED ORDER — EPINEPHRINE 0.3 MG/0.3ML IJ SOAJ: 0.3000 mg | Freq: Once | INTRAMUSCULAR | Status: DC | PRN

## 2020-08-16 MED ORDER — DIPHENHYDRAMINE HCL 50 MG/ML IJ SOLN: 50.0000 mg | Freq: Once | INTRAMUSCULAR | Status: DC | PRN

## 2020-08-16 MED ORDER — FAMOTIDINE IN NACL 20-0.9 MG/50ML-% IV SOLN: 20.0000 mg | Freq: Once | INTRAVENOUS | Status: DC | PRN

## 2020-08-16 MED ORDER — ALBUTEROL SULFATE HFA 108 (90 BASE) MCG/ACT IN AERS: 2.0000 | INHALATION_SPRAY | Freq: Once | RESPIRATORY_TRACT | Status: DC | PRN

## 2020-08-16 MED ORDER — SOTROVIMAB 500 MG/8ML IV SOLN
500.0000 mg | Freq: Once | INTRAVENOUS | Status: AC
  Administered 2020-08-16: 500 mg via INTRAVENOUS

## 2020-08-16 NOTE — Progress Notes (Signed)
Patient reviewed Fact Sheet for Patients, Parents, and Caregivers for Emergency Use Authorization (EUA) of sotrovimab for the Treatment of Coronavirus. Patient also reviewed and is agreeable to the estimated cost of treatment. Patient is agreeable to proceed.   

## 2020-08-16 NOTE — Progress Notes (Signed)
I connected by phone with Richard Jensen on 08/16/2020 at 11:06 AM to discuss the potential use of a new treatment for mild to moderate COVID-19 viral infection in non-hospitalized patients.  This patient is a 70 y.o. male that meets the FDA criteria for Emergency Use Authorization of COVID monoclonal antibody sotrovimab.  Has a (+) direct SARS-CoV-2 viral test result  Has mild or moderate COVID-19   Is NOT hospitalized due to COVID-19  Is within 10 days of symptom onset  Has at least one of the high risk factor(s) for progression to severe COVID-19 and/or hospitalization as defined in EUA.  Specific high risk criteria : Older age (>/= 69 yo) and BMI > 25   I have spoken and communicated the following to the patient or parent/caregiver regarding COVID monoclonal antibody treatment:  1. FDA has authorized the emergency use for the treatment of mild to moderate COVID-19 in adults and pediatric patients with positive results of direct SARS-CoV-2 viral testing who are 51 years of age and older weighing at least 40 kg, and who are at high risk for progressing to severe COVID-19 and/or hospitalization.  2. The significant known and potential risks and benefits of COVID monoclonal antibody, and the extent to which such potential risks and benefits are unknown.  3. Information on available alternative treatments and the risks and benefits of those alternatives, including clinical trials.  4. Patients treated with COVID monoclonal antibody should continue to self-isolate and use infection control measures (e.g., wear mask, isolate, social distance, avoid sharing personal items, clean and disinfect "high touch" surfaces, and frequent handwashing) according to CDC guidelines.   5. The patient or parent/caregiver has the option to accept or refuse COVID monoclonal antibody treatment.  After reviewing this information with the patient, the patient has agreed to receive one of the available covid 19  monoclonal antibodies and will be provided an appropriate fact sheet prior to infusion. Scot Dock, NP 08/16/2020 11:06 AM

## 2020-08-16 NOTE — Progress Notes (Signed)
Diagnosis: COVID-19  Physician: Dr. Patrick Wright  Procedure: [facility] Med: Sotrovimab infusion - Provided patient with sotrovimab fact sheet for patients, parents, and caregivers prior to infusion.   Complications: No immediate complications noted  Discharge: Discharged home    

## 2020-08-16 NOTE — Discharge Instructions (Signed)

## 2020-08-28 ENCOUNTER — Ambulatory Visit (INDEPENDENT_AMBULATORY_CARE_PROVIDER_SITE_OTHER): Payer: 59

## 2020-08-28 ENCOUNTER — Ambulatory Visit
Admission: EM | Admit: 2020-08-28 | Discharge: 2020-08-28 | Disposition: A | Discharge status: Home / Self Care | Payer: 59 | Attending: Emergency Medicine | Admitting: Emergency Medicine

## 2020-08-28 ENCOUNTER — Ambulatory Visit: Payer: Self-pay

## 2020-08-28 ENCOUNTER — Other Ambulatory Visit: Payer: Self-pay

## 2020-08-28 DIAGNOSIS — R059 Cough, unspecified: Secondary | ICD-10-CM | POA: Diagnosis not present

## 2020-08-28 DIAGNOSIS — J0100 Acute maxillary sinusitis, unspecified: Secondary | ICD-10-CM

## 2020-08-28 DIAGNOSIS — R053 Chronic cough: Secondary | ICD-10-CM

## 2020-08-28 MED ORDER — PREDNISONE 20 MG PO TABS: 40.0000 mg | ORAL_TABLET | Freq: Every day | ORAL | 0 refills | Status: AC

## 2020-08-28 MED ORDER — AMOXICILLIN-POT CLAVULANATE 875-125 MG PO TABS: 1.0000 | ORAL_TABLET | Freq: Two times a day (BID) | ORAL | 0 refills | Status: AC

## 2020-08-28 NOTE — Discharge Instructions (Signed)
Your chest xray looks well today, there is no evidence of pneumonia.  With the amount of facial pressure, drainage, and productive cough, I would like to try a different antibiotics that would cover for sinus infection better.  I am hopeful that 5 days of a steroid will also help with your cough.  Continue to use previously prescribed medications as needed.  Please follow up with your PCP if symptoms persist.  Return or go to the ER for any worsening of symptoms- chest pain, shortness of breath , fevers, or otherwise worsening.

## 2020-08-28 NOTE — ED Provider Notes (Signed)
[facility]    CSN: [id number] Arrival date & time: 08/28/20  0954      History   Chief Complaint Chief Complaint  Patient presents with  . Cough    HPI Richard Jensen is a 70 y.o. male.   Richard Jensen presents with complaints of persistent and worsening of productive cough. Was seen and diagnosed with pneumonia 1/5 and given azithromycin. Symptoms never resolved. He then got covid-19, treated with MAB 2/14. Cough worsened with covid. He feels like cough today is worse than it was earlier this week. No new fevers. No shortness of breath . Pain to chest only with coughing, otherwise no chest pain . Facial pressure and congestion is still present. No history of COPD or asthma. No new lower extremity edema. Tessalon and atrovent were previously prescribed, as well as promethazine DM cough syrup.     ROS per HPI, negative if not otherwise mentioned.      Past Medical History:  Diagnosis Date  . GERD (gastroesophageal reflux disease)   . Hypercholesteremia   . Hypertension   . Myocardial infarct (HCC)     There are no problems to display for this patient.   Past Surgical History:  Procedure Laterality Date  . JOINT REPLACEMENT         Home Medications    Prior to Admission medications   Medication Sig Start Date End Date Taking? Authorizing Provider  amLODipine (NORVASC) 10 MG tablet Take 10 mg by mouth daily.   Yes [provider]  amoxicillin-clavulanate (AUGMENTIN) 875-125 MG tablet Take 1 tablet by mouth every 12 (twelve) hours for 10 days. 08/28/20 09/07/20 Yes Abriana Saltos, Lanelle Bal B, NP  atorvastatin (LIPITOR) 80 MG tablet Take 80 mg by mouth daily.   Yes [provider]  enalapril (VASOTEC) 20 MG tablet Take 20 mg by mouth daily.   Yes [provider]  ipratropium (ATROVENT) 0.06 % nasal spray Place 2 sprays into both nostrils 4 (four) times daily. 08/10/20  Yes Margarette Canada, NP  naproxen (NAPROSYN) 500 MG tablet Take 500 mg by  mouth 2 (two) times daily with a meal.   Yes [provider]  predniSONE (DELTASONE) 20 MG tablet Take 2 tablets (40 mg total) by mouth daily with breakfast for 5 days. 08/28/20 09/02/20 Yes Iram Lundberg, Malachy Moan, NP  albuterol (VENTOLIN HFA) 108 (90 Base) MCG/ACT inhaler Inhale 1-2 puffs into the lungs every 6 (six) hours as needed for wheezing or shortness of breath. 07/07/20   Verda Cumins, MD  benzonatate (TESSALON) 100 MG capsule Take 2 capsules (200 mg total) by mouth every 8 (eight) hours. 08/10/20   Margarette Canada, NP  promethazine-dextromethorphan (PROMETHAZINE-DM) 6.25-15 MG/5ML syrup Take 5 mLs by mouth 4 (four) times daily as needed. Patient taking differently: Take 5 mLs by mouth 4 (four) times daily as needed. 08/10/20   Margarette Canada, NP  ranitidine (ZANTAC) 150 MG capsule Take 150 mg by mouth 2 (two) times daily.    [provider]  fluticasone (FLONASE) 50 MCG/ACT nasal spray Place 1 spray into both nostrils daily. 04/14/18 08/10/20  Marylene Land, NP    Family History Family History  Problem Relation Age of Onset  . Irregular heart beat Mother   . Diabetes Father     Social History Social History   Tobacco Use  . Smoking status: Never Smoker  . Smokeless tobacco: Never Used  Vaping Use  . Vaping Use: Never used  Substance Use Topics  . Alcohol use: Yes  Alcohol/week: 2.0 standard drinks    Types: 2 Cans of beer per week    Comment: beers per day  . Drug use: Never     Allergies   Patient has no known allergies.   Review of Systems Review of Systems   Physical Exam Triage Vital Signs ED Triage Vitals  Enc Vitals Group     BP 08/28/20 1006 133/74     Pulse Rate 08/28/20 1006 60     Resp 08/28/20 1006 18     Temp 08/28/20 1006 98.3 F (36.8 C)     Temp Source 08/28/20 1006 Oral     SpO2 08/28/20 1006 100 %     Weight 08/28/20 1004 227 lb (103 kg)     Height 08/28/20 1004 5\' 11"  (1.803 m)     Head Circumference --      Peak Flow --       Pain Score 08/28/20 1004 7     Pain Loc --      Pain Edu? --      Excl. in [place]? --    No data found.  Updated Vital Signs BP 133/74 (BP Location: Left Arm)   Pulse 60   Temp 98.3 F (36.8 C) (Oral)   Resp 18   Ht 5\' 11"  (1.803 m)   Wt 227 lb (103 kg)   SpO2 100%   BMI 31.66 kg/m   Visual Acuity Right Eye Distance:   Left Eye Distance:   Bilateral Distance:    Right Eye Near:   Left Eye Near:    Bilateral Near:     Physical Exam Constitutional:      Appearance: He is well-developed.  HENT:     Nose:     Right Sinus: Maxillary sinus tenderness present.     Left Sinus: Maxillary sinus tenderness present.  Cardiovascular:     Rate and Rhythm: Normal rate.  Pulmonary:     Effort: Pulmonary effort is normal.     Breath sounds: Normal breath sounds.     Comments: Cough triggered by deep breathing; lungs grossly clear; strong congested cough noted  Skin:    General: Skin is warm and dry.  Neurological:     Mental Status: He is alert and oriented to person, place, and time.      UC Treatments / Results  Labs (all labs ordered are listed, but only abnormal results are displayed) Labs Reviewed - No data to display  EKG   Radiology DG Chest 2 View  Result Date: 08/28/2020 CLINICAL DATA:  Persistent cough, recent pneumonia. Positive COVID-19 test 2 weeks ago. EXAM: CHEST - 2 VIEW COMPARISON:  None. FINDINGS: Lungs are adequately inflated and otherwise clear. Cardiomediastinal silhouette is normal. Mild degenerative changes of the spine. Right shoulder arthroplasty is present. IMPRESSION: No active cardiopulmonary disease. Electronically Signed   By: Marin Olp M.D.   On: 08/28/2020 10:30    Procedures Procedures (including critical care time)  Medications Ordered in UC Medications - No data to display  Initial Impression / Assessment and Plan / UC Course  I have reviewed the triage vital signs and the nursing notes.  Pertinent labs & imaging results that  were available during my care of the patient were reviewed by me and considered in my medical decision making (see chart for details).     Symptoms consistent with sinusitis at this time, on exam, with worsening of sinus pain and drainage as well as persistent cough. Recent covid-19 infection. Opted  to treat with augmentin and prednisone to see if relief can be reached. Return precautions provided. Patient verbalized understanding and agreeable to plan.   Final Clinical Impressions(s) / UC Diagnoses   Final diagnoses:  Cough  Acute maxillary sinusitis, recurrence not specified     Discharge Instructions     Your chest xray looks well today, there is no evidence of pneumonia.  With the amount of facial pressure, drainage, and productive cough, I would like to try a different antibiotics that would cover for sinus infection better.  I am hopeful that 5 days of a steroid will also help with your cough.  Continue to use previously prescribed medications as needed.  Please follow up with your PCP if symptoms persist.  Return or go to the ER for any worsening of symptoms- chest pain, shortness of breath , fevers, or otherwise worsening.     ED Prescriptions    Medication Sig Dispense Auth. Provider   amoxicillin-clavulanate (AUGMENTIN) 875-125 MG tablet Take 1 tablet by mouth every 12 (twelve) hours for 10 days. 20 tablet Augusto Gamble B, NP   predniSONE (DELTASONE) 20 MG tablet Take 2 tablets (40 mg total) by mouth daily with breakfast for 5 days. 10 tablet Zigmund Gottron, NP     PDMP not reviewed this encounter.   Zigmund Gottron, NP 08/28/20 1041

## 2020-08-28 NOTE — ED Triage Notes (Signed)
Patient states that he has been having a cough for several weeks. States that he was diagnosed with pneumonia and finished a z-pak and improved but has worsened again over the last week. Patient states that his cough is productive.

## 2021-07-06 ENCOUNTER — Other Ambulatory Visit: Payer: Self-pay

## 2021-07-06 ENCOUNTER — Ambulatory Visit
Admission: RE | Admit: 2021-07-06 | Discharge: 2021-07-06 | Disposition: A | Discharge status: Home / Self Care | Payer: 59 | Source: Ambulatory Visit | Attending: Emergency Medicine | Admitting: Emergency Medicine

## 2021-07-06 VITALS — BP 129/56 | HR 79 | Temp 98.2°F | Resp 16 | Ht 71.0 in | Wt 227.1 lb

## 2021-07-06 DIAGNOSIS — J069 Acute upper respiratory infection, unspecified: Secondary | ICD-10-CM | POA: Diagnosis not present

## 2021-07-06 DIAGNOSIS — J40 Bronchitis, not specified as acute or chronic: Secondary | ICD-10-CM | POA: Insufficient documentation

## 2021-07-06 DIAGNOSIS — Z20822 Contact with and (suspected) exposure to covid-19: Secondary | ICD-10-CM | POA: Diagnosis not present

## 2021-07-06 MED ORDER — ALBUTEROL SULFATE HFA 108 (90 BASE) MCG/ACT IN AERS: 1.0000 | INHALATION_SPRAY | Freq: Four times a day (QID) | RESPIRATORY_TRACT | 0 refills | Status: DC | PRN

## 2021-07-06 MED ORDER — IPRATROPIUM BROMIDE 0.06 % NA SOLN: 2.0000 | Freq: Four times a day (QID) | NASAL | 12 refills | Status: DC

## 2021-07-06 MED ORDER — BENZONATATE 100 MG PO CAPS: 200.0000 mg | ORAL_CAPSULE | Freq: Three times a day (TID) | ORAL | 0 refills | Status: DC

## 2021-07-06 MED ORDER — PROMETHAZINE-DM 6.25-15 MG/5ML PO SYRP: 5.0000 mL | ORAL_SOLUTION | Freq: Four times a day (QID) | ORAL | 0 refills | Status: DC | PRN

## 2021-07-06 NOTE — Discharge Instructions (Signed)
Use the albuterol inhaler/nebulizer every 4-6 hours as needed for shortness of breath, wheezing, and cough.  Use the Atrovent nasal spray, 2 puffs every 6 hours as needed for nasal congestion..  Use the Tessalon Perles every 8 hours for your cough.  Taken with a small sip of water.  They may give you some numbness to the base of your tongue or metallic taste in her mouth, this is normal.  They are designed to calm down the cough reflex.  Use the Promethazine DM cough syrup at bedtime as will make you drowsy.  You may take 1 teaspoon (5 mL) every 6 hours.  Return for reevaluation for new or worsening symptoms.

## 2021-07-06 NOTE — ED Provider Notes (Signed)
[facility]    CSN: [id number] Arrival date & time: 07/06/21  1044      History   Chief Complaint Chief Complaint  Patient presents with   Cough    APPOINTMENT 11AM   Facial Pain    HPI Tonio Seider is a 71 y.o. male.   HPI  71 year old male here for evaluation of respiratory complaints.  Patient reports of last 3 days he has been experiencing frontal sinus headache, runny nose nasal congestion for clear nasal discharge, right ear pressure, sore throat, cough that is intermittently productive for green sputum, headache, and body aches.  He denies fever, shortness breath or wheezing, or GI complaints.  No known sick contacts.  Past Medical History:  Diagnosis Date   GERD (gastroesophageal reflux disease)    Hypercholesteremia    Hypertension    Myocardial infarct ([place])     There are no problems to display for this patient.   Past Surgical History:  Procedure Laterality Date   JOINT REPLACEMENT         Home Medications    Prior to Admission medications   Medication Sig Start Date End Date Taking? Authorizing Provider  amLODipine (NORVASC) 10 MG tablet Take 10 mg by mouth daily.   Yes [provider]  atorvastatin (LIPITOR) 80 MG tablet Take 80 mg by mouth daily.   Yes [provider]  enalapril (VASOTEC) 20 MG tablet Take 20 mg by mouth daily.   Yes [provider]  naproxen (NAPROSYN) 500 MG tablet Take 500 mg by mouth 2 (two) times daily with a meal.   Yes [provider]  ranitidine (ZANTAC) 150 MG capsule Take 150 mg by mouth 2 (two) times daily.   Yes [provider]  albuterol (VENTOLIN HFA) 108 (90 Base) MCG/ACT inhaler Inhale 1-2 puffs into the lungs every 6 (six) hours as needed for wheezing or shortness of breath. 07/06/21   Margarette Canada, NP  aspirin 81 MG EC tablet aspirin 81 mg tablet,delayed release  TAKE 1 TABLET BY MOUTH TWICE DAILY AFTER MEALS FOR 14 DAYS START DAY AFTER SURGERY    [provider]  benzonatate (TESSALON) 100 MG capsule Take 2 capsules (200 mg total) by mouth every 8 (eight) hours. 07/06/21   Margarette Canada, NP  ipratropium (ATROVENT) 0.06 % nasal spray Place 2 sprays into both nostrils 4 (four) times daily. 07/06/21   Margarette Canada, NP  promethazine-dextromethorphan (PROMETHAZINE-DM) 6.25-15 MG/5ML syrup Take 5 mLs by mouth 4 (four) times daily as needed. 07/06/21   Margarette Canada, NP  fluticasone (FLONASE) 50 MCG/ACT nasal spray Place 1 spray into both nostrils daily. 04/14/18 08/10/20  Marylene Land, NP    Family History Family History  Problem Relation Age of Onset   Irregular heart beat Mother    Diabetes Father     Social History Social History   Tobacco Use   Smoking status: Never   Smokeless tobacco: Never  Vaping Use   Vaping Use: Never used  Substance Use Topics   Alcohol use: Yes    Alcohol/week: 2.0 standard drinks    Types: 2 Cans of beer per week    Comment: beers per day   Drug use: Never     Allergies   Patient has no known allergies.   Review of Systems Review of Systems  Constitutional:  Negative for activity change, appetite change and fever.  HENT:  Positive for congestion, ear pain, rhinorrhea, sinus pressure and sore throat.   Respiratory:  Positive for cough. Negative for shortness of breath and wheezing.   Gastrointestinal:  Negative for diarrhea, nausea and vomiting.  Musculoskeletal:  Positive for arthralgias and myalgias.  Skin:  Negative for rash.  Neurological:  Positive for headaches.  Hematological: Negative.   Psychiatric/Behavioral: Negative.      Physical Exam Triage Vital Signs ED Triage Vitals  Enc Vitals Group     BP 07/06/21 1102 (!) 129/56     Pulse Rate 07/06/21 1102 79     Resp 07/06/21 1102 16     Temp 07/06/21 1102 98.2 F (36.8 C)     Temp Source 07/06/21 1102 Oral     SpO2 07/06/21 1102 100 %     Weight 07/06/21 1100 227 lb 1.2 oz (103 kg)     Height 07/06/21 1100 5\' 11"  (1.803 m)      Head Circumference --      Peak Flow --      Pain Score 07/06/21 1100 7     Pain Loc --      Pain Edu? --      Excl. in [place]? --    No data found.  Updated Vital Signs BP (!) 129/56 (BP Location: Left Arm)    Pulse 79    Temp 98.2 F (36.8 C) (Oral)    Resp 16    Ht 5\' 11"  (1.803 m)    Wt 227 lb 1.2 oz (103 kg)    SpO2 100%    BMI 31.67 kg/m   Visual Acuity Right Eye Distance:   Left Eye Distance:   Bilateral Distance:    Right Eye Near:   Left Eye Near:    Bilateral Near:     Physical Exam Vitals and nursing note reviewed.  Constitutional:      General: He is not in acute distress.    Appearance: Normal appearance. He is not ill-appearing.  HENT:     Head: Normocephalic and atraumatic.     Right Ear: Tympanic membrane, ear canal and external ear normal. There is no impacted cerumen.     Left Ear: Tympanic membrane, ear canal and external ear normal. There is no impacted cerumen.     Nose: Congestion and rhinorrhea present.     Mouth/Throat:     Mouth: Mucous membranes are moist.     Pharynx: Oropharynx is clear. Posterior oropharyngeal erythema present.  Cardiovascular:     Rate and Rhythm: Normal rate and regular rhythm.     Pulses: Normal pulses.     Heart sounds: Normal heart sounds. No murmur heard.   No friction rub. No gallop.  Pulmonary:     Effort: Pulmonary effort is normal.     Breath sounds: Wheezing present. No rhonchi or rales.  Musculoskeletal:     Cervical back: Normal range of motion and neck supple.  Lymphadenopathy:     Cervical: No cervical adenopathy.  Skin:    General: Skin is warm and dry.     Capillary Refill: Capillary refill takes less than 2 seconds.     Findings: No erythema or rash.  Neurological:     General: No focal deficit present.     Mental Status: He is alert and oriented to person, place, and time.  Psychiatric:        Mood and Affect: Mood normal.        Behavior: Behavior normal.        Thought Content: Thought content  normal.  Judgment: Judgment normal.     UC Treatments / Results  Labs (all labs ordered are listed, but only abnormal results are displayed) Labs Reviewed  SARS CORONAVIRUS 2 (TAT 6-24 HRS)    EKG   Radiology No results found.  Procedures Procedures (including critical care time)  Medications Ordered in UC Medications - No data to display  Initial Impression / Assessment and Plan / UC Course  I have reviewed the triage vital signs and the nursing notes.  Pertinent labs & imaging results that were available during my care of the patient were reviewed by me and considered in my medical decision making (see chart for details).  Patient is a nontoxic-appearing 71 year old male here for evaluation of respiratory complaints as outlined in HPI above.  Patient's physical exam reveals pearly gray tympanic membranes bilaterally with normal light reflex and clear external auditory canals.  Nasal mucosa is erythematous and edematous with scant clear discharge in both nares.  Oropharyngeal exam reveals mild posterior oropharyngeal erythema with clear postnasal drip.  No cervical lymphadenopathy appreciated on exam.  Cardiopulmonary exam reveals wheezes in middle and upper lobes bilaterally.  No rhonchi or rales appreciated.  Patient exam is consistent with a viral URI and developing bronchitis.  Patient has been vaccinated and boosted against COVID but I will swab him for COVID and discharged home to isolate pending the results.  I will refill his albuterol inhaler for use as needed with wheezing or shortness of breath.  [place] of Atrovent nasal spray to up the nasal congestion, Tessalon Perles and Promethazine DM cough syrup to help with cough and congestion.  ER precautions reviewed with patient.   Final Clinical Impressions(s) / UC Diagnoses   Final diagnoses:  Upper respiratory tract infection, unspecified type  Bronchitis     Discharge Instructions      Use the albuterol  inhaler/nebulizer every 4-6 hours as needed for shortness of breath, wheezing, and cough.  Use the Atrovent nasal spray, 2 puffs every 6 hours as needed for nasal congestion..  Use the Tessalon Perles every 8 hours for your cough.  Taken with a small sip of water.  They may give you some numbness to the base of your tongue or metallic taste in her mouth, this is normal.  They are designed to calm down the cough reflex.  Use the Promethazine DM cough syrup at bedtime as will make you drowsy.  You may take 1 teaspoon (5 mL) every 6 hours.  Return for reevaluation for new or worsening symptoms.      ED Prescriptions     Medication Sig Dispense Auth. Provider   albuterol (VENTOLIN HFA) 108 (90 Base) MCG/ACT inhaler Inhale 1-2 puffs into the lungs every 6 (six) hours as needed for wheezing or shortness of breath. 18 g Margarette Canada, NP   benzonatate (TESSALON) 100 MG capsule Take 2 capsules (200 mg total) by mouth every 8 (eight) hours. 21 capsule Margarette Canada, NP   ipratropium (ATROVENT) 0.06 % nasal spray Place 2 sprays into both nostrils 4 (four) times daily. 15 mL Margarette Canada, NP   promethazine-dextromethorphan (PROMETHAZINE-DM) 6.25-15 MG/5ML syrup Take 5 mLs by mouth 4 (four) times daily as needed. 118 mL Margarette Canada, NP      PDMP not reviewed this encounter.   Margarette Canada, NP 07/06/21 1233

## 2021-07-06 NOTE — ED Triage Notes (Signed)
Pt c/o of sinus pain and headache x 3 days. Pt reports cough up green mucus,

## 2021-07-07 LAB — SARS CORONAVIRUS 2 (TAT 6-24 HRS): SARS Coronavirus 2: NEGATIVE

## 2021-07-14 ENCOUNTER — Ambulatory Visit: Payer: Self-pay

## 2021-07-15 ENCOUNTER — Other Ambulatory Visit: Payer: Self-pay

## 2021-07-15 ENCOUNTER — Ambulatory Visit (INDEPENDENT_AMBULATORY_CARE_PROVIDER_SITE_OTHER): Payer: 59

## 2021-07-15 ENCOUNTER — Ambulatory Visit
Admission: RE | Admit: 2021-07-15 | Discharge: 2021-07-15 | Disposition: A | Discharge status: Home / Self Care | Payer: 59 | Source: Ambulatory Visit | Attending: Medical Oncology | Admitting: Medical Oncology

## 2021-07-15 VITALS — BP 150/67 | HR 77 | Temp 98.3°F | Resp 18 | Ht 71.0 in | Wt 227.1 lb

## 2021-07-15 DIAGNOSIS — R0981 Nasal congestion: Secondary | ICD-10-CM

## 2021-07-15 DIAGNOSIS — R058 Other specified cough: Secondary | ICD-10-CM | POA: Diagnosis not present

## 2021-07-15 DIAGNOSIS — R059 Cough, unspecified: Secondary | ICD-10-CM

## 2021-07-15 MED ORDER — PREDNISONE 10 MG PO TABS: 20.0000 mg | ORAL_TABLET | Freq: Every day | ORAL | 0 refills | Status: AC

## 2021-07-15 NOTE — ED Triage Notes (Signed)
Pt here with C/O not getting better was in here 9 days ago, states headache sinus pressure has got worst.

## 2021-07-15 NOTE — ED Provider Notes (Signed)
[facility]    CSN: [id number] Arrival date & time: 07/15/21  1448      History   Chief Complaint Chief Complaint  Patient presents with   Nasal Congestion   Cough    HPI Richard Jensen is a 71 y.o. male.   HPI  Cold Symptoms: Patient reports that they have had symptoms of productive and dry cough, nasal congestion for the past 9 days. Symptoms are stable. They deny SOB, chest pain, fever or vomiting. They have tried Atrovent, tessalon, promethazine syrup, albuterol for symptoms with only mild relief. No known sick contacts. Patient is a non-smoker without history of COPD.    Past Medical History:  Diagnosis Date   GERD (gastroesophageal reflux disease)    Hypercholesteremia    Hypertension    Myocardial infarct ([place])     There are no problems to display for this patient.   Past Surgical History:  Procedure Laterality Date   JOINT REPLACEMENT         Home Medications    Prior to Admission medications   Medication Sig Start Date End Date Taking? Authorizing Provider  albuterol (VENTOLIN HFA) 108 (90 Base) MCG/ACT inhaler Inhale 1-2 puffs into the lungs every 6 (six) hours as needed for wheezing or shortness of breath. 07/06/21  Yes Margarette Canada, NP  amLODipine (NORVASC) 10 MG tablet Take 10 mg by mouth daily.   Yes [provider]  aspirin 81 MG EC tablet aspirin 81 mg tablet,delayed release  TAKE 1 TABLET BY MOUTH TWICE DAILY AFTER MEALS FOR 14 DAYS START DAY AFTER SURGERY   Yes [provider]  atorvastatin (LIPITOR) 80 MG tablet Take 80 mg by mouth daily.   Yes [provider]  benzonatate (TESSALON) 100 MG capsule Take 2 capsules (200 mg total) by mouth every 8 (eight) hours. 07/06/21  Yes Margarette Canada, NP  enalapril (VASOTEC) 20 MG tablet Take 20 mg by mouth daily.   Yes [provider]  ipratropium (ATROVENT) 0.06 % nasal spray Place 2 sprays into both nostrils 4 (four) times daily. 07/06/21  Yes Margarette Canada, NP   naproxen (NAPROSYN) 500 MG tablet Take 500 mg by mouth 2 (two) times daily with a meal.   Yes [provider]  predniSONE (DELTASONE) 10 MG tablet Take 2 tablets (20 mg total) by mouth daily with breakfast for 5 days. 07/15/21 07/20/21 Yes Idalee Foxworthy, Holli Humbles, PA-C  promethazine-dextromethorphan (PROMETHAZINE-DM) 6.25-15 MG/5ML syrup Take 5 mLs by mouth 4 (four) times daily as needed. 07/06/21  Yes Margarette Canada, NP  ranitidine (ZANTAC) 150 MG capsule Take 150 mg by mouth 2 (two) times daily.   Yes [provider]  fluticasone (FLONASE) 50 MCG/ACT nasal spray Place 1 spray into both nostrils daily. 04/14/18 08/10/20  Marylene Land, NP    Family History Family History  Problem Relation Age of Onset   Irregular heart beat Mother    Diabetes Father     Social History Social History   Tobacco Use   Smoking status: Never   Smokeless tobacco: Never  Vaping Use   Vaping Use: Never used  Substance Use Topics   Alcohol use: Yes    Alcohol/week: 2.0 standard drinks    Types: 2 Cans of beer per week    Comment: beers per day   Drug use: Never     Allergies   Patient has no known allergies.   Review of Systems Review of Systems  As stated above in HPI Physical Exam Triage  Vital Signs ED Triage Vitals  Enc Vitals Group     BP 07/15/21 1500 (!) 150/67     Pulse Rate 07/15/21 1500 77     Resp 07/15/21 1500 18     Temp 07/15/21 1500 98.3 F (36.8 C)     Temp Source 07/15/21 1500 Oral     SpO2 07/15/21 1500 99 %     Weight 07/15/21 1458 227 lb 1.2 oz (103 kg)     Height 07/15/21 1458 5\' 11"  (1.803 m)     Head Circumference --      Peak Flow --      Pain Score 07/15/21 1458 5     Pain Loc --      Pain Edu? --      Excl. in [place]? --    No data found.  Updated Vital Signs BP (!) 150/67 (BP Location: Left Arm)    Pulse 77    Temp 98.3 F (36.8 C) (Oral)    Resp 18    Ht 5\' 11"  (1.803 m)    Wt 227 lb 1.2 oz (103 kg)    SpO2 99%    BMI 31.67 kg/m    Physical  Exam Vitals and nursing note reviewed.  Constitutional:      General: He is not in acute distress.    Appearance: He is well-developed.  HENT:     Head: Normocephalic and atraumatic.     Right Ear: Tympanic membrane normal.     Left Ear: Tympanic membrane normal.     Nose: Congestion (No sinus bogginess) and rhinorrhea present.     Mouth/Throat:     Mouth: Mucous membranes are moist.     Pharynx: Oropharynx is clear. No oropharyngeal exudate or posterior oropharyngeal erythema.  Eyes:     Conjunctiva/sclera: Conjunctivae normal.  Cardiovascular:     Rate and Rhythm: Normal rate and regular rhythm.     Heart sounds: No murmur heard. Pulmonary:     Effort: Pulmonary effort is normal. No respiratory distress.     Breath sounds: Normal breath sounds.  Abdominal:     Palpations: Abdomen is soft.     Tenderness: There is no abdominal tenderness.  Musculoskeletal:        General: No swelling.     Cervical back: Neck supple.  Lymphadenopathy:     Cervical: No cervical adenopathy.  Skin:    General: Skin is warm and dry.     Capillary Refill: Capillary refill takes less than 2 seconds.  Neurological:     Mental Status: He is alert.  Psychiatric:        Mood and Affect: Mood normal.     UC Treatments / Results  Labs (all labs ordered are listed, but only abnormal results are displayed) Labs Reviewed - No data to display  EKG   Radiology DG Chest 2 View  Result Date: 07/15/2021 CLINICAL DATA:  Worsening productive cough. EXAM: CHEST - 2 VIEW COMPARISON:  Chest x-ray dated August 28, 2020. FINDINGS: The heart size and mediastinal contours are within normal limits. Both lungs are clear. The visualized skeletal structures are unremarkable. IMPRESSION: No active cardiopulmonary disease. Electronically Signed   By: Titus Dubin M.D.   On: 07/15/2021 15:13    Procedures Procedures (including critical care time)  Medications Ordered in UC Medications - No data to  display  Initial Impression / Assessment and Plan / UC Course  I have reviewed the triage vital signs and the nursing notes.  Pertinent labs & imaging results that were available during my care of the patient were reviewed by me and considered in my medical decision making (see chart for details).     New. Exam, vitals and chest x ray stable and improved from previous exam findings. Discussed likely viral that still needs a few days to resolve. For now a sport low course of steroids to help should he want to take these. Discussed red flag signs and symptoms. Should symptoms worsen, fail to resolve or he gets a fever he will follow up with his PCP or our office.  Final Clinical Impressions(s) / UC Diagnoses   Final diagnoses:  Productive cough  Nasal congestion   Discharge Instructions   None    ED Prescriptions     Medication Sig Dispense Auth. Provider   predniSONE (DELTASONE) 10 MG tablet Take 2 tablets (20 mg total) by mouth daily with breakfast for 5 days. 10 tablet Hughie Closs, [place]      PDMP not reviewed this encounter.   Hughie Closs, [place] 07/15/21 1530

## 2022-03-05 IMAGING — CR DG CHEST 2V
2 series · 3 of 3 positions shown · non-contrast
Comparison: None.

CLINICAL DATA: Persistent cough, recent pneumonia. Positive
[id number] test 2 weeks ago.

EXAM:
CHEST - 2 VIEW

[chest pa]
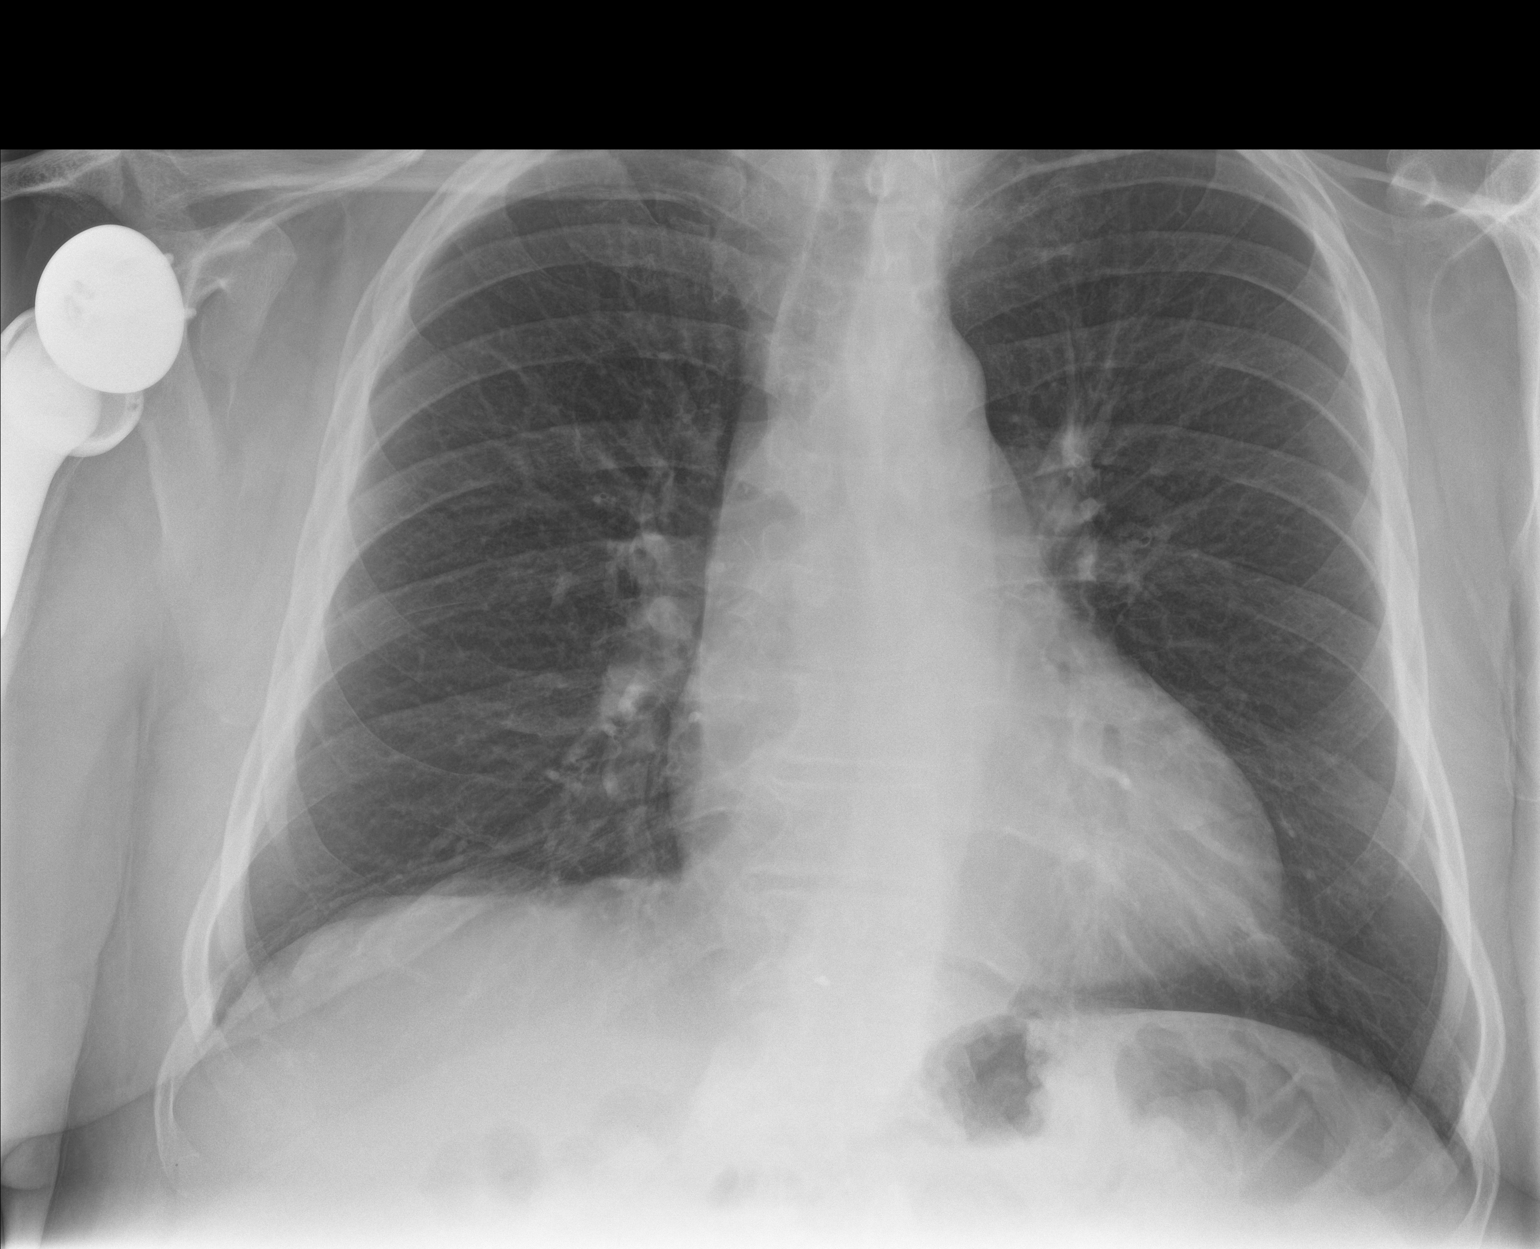

[Series 2: chest lat · 0.14mm/px · 2 of 2 slices shown]
[im 1/2]
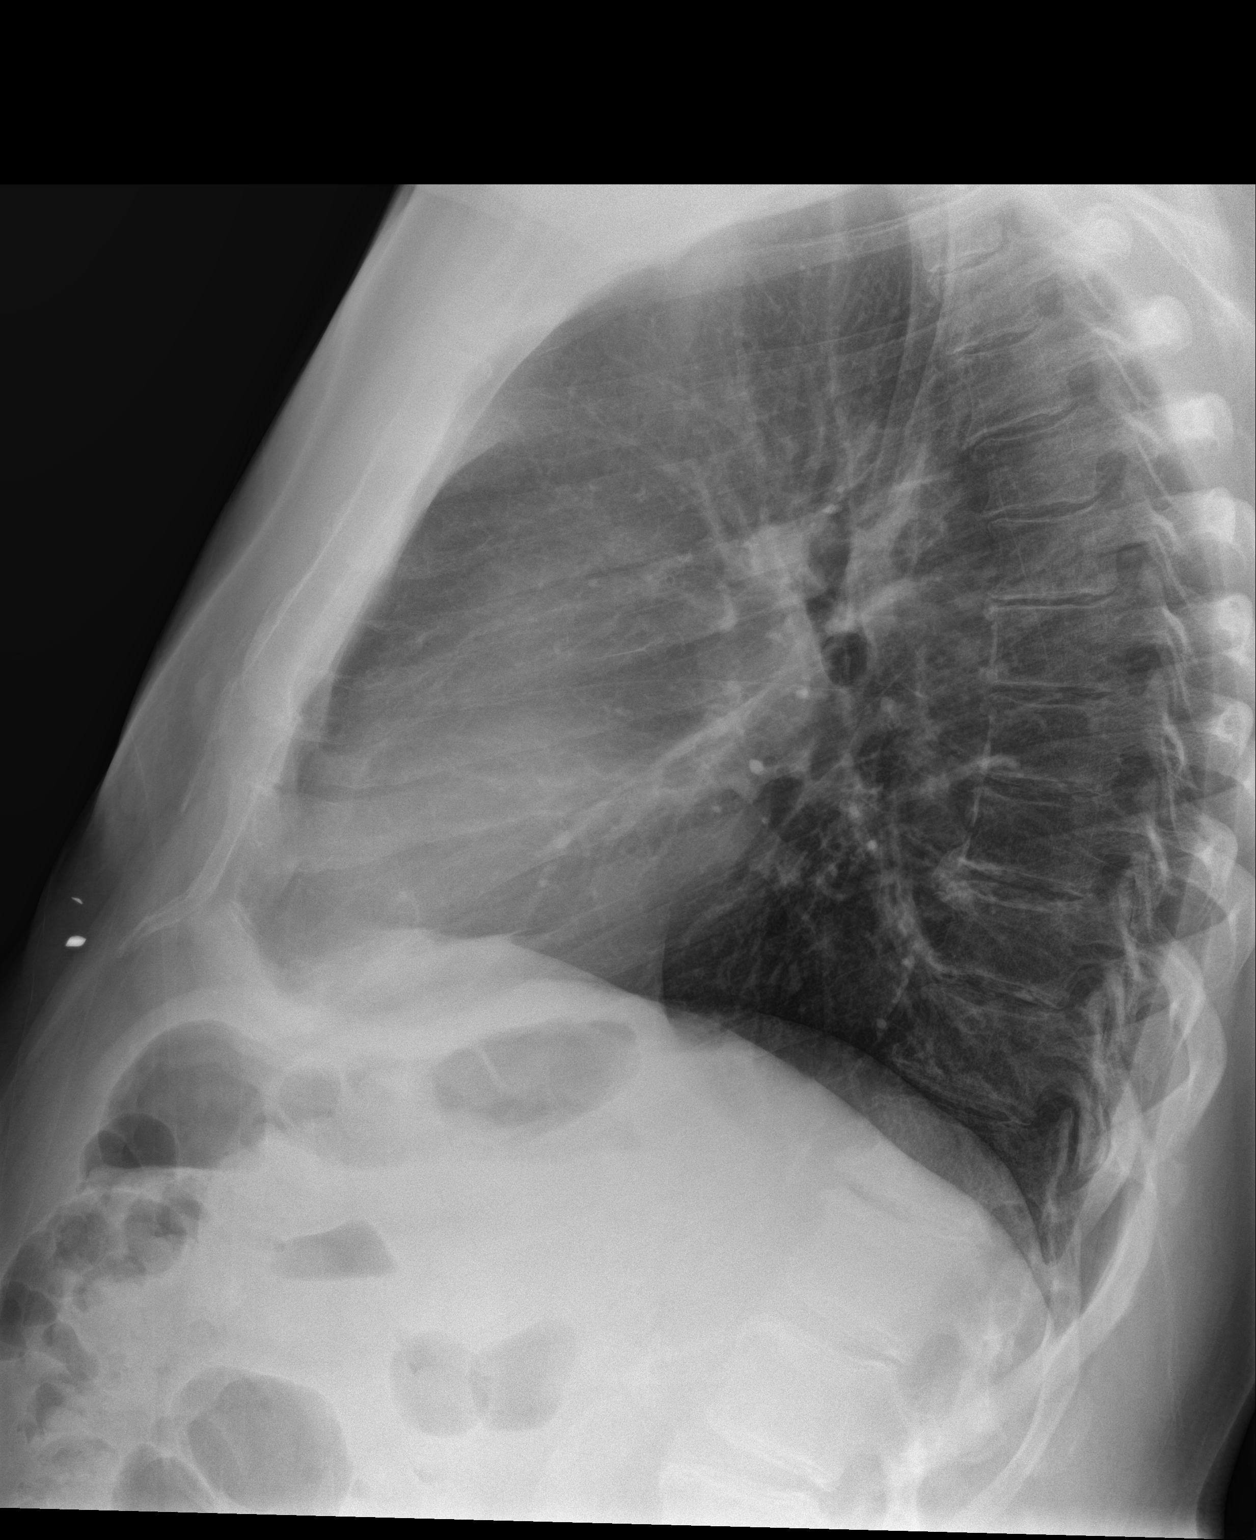
[im 2/2]
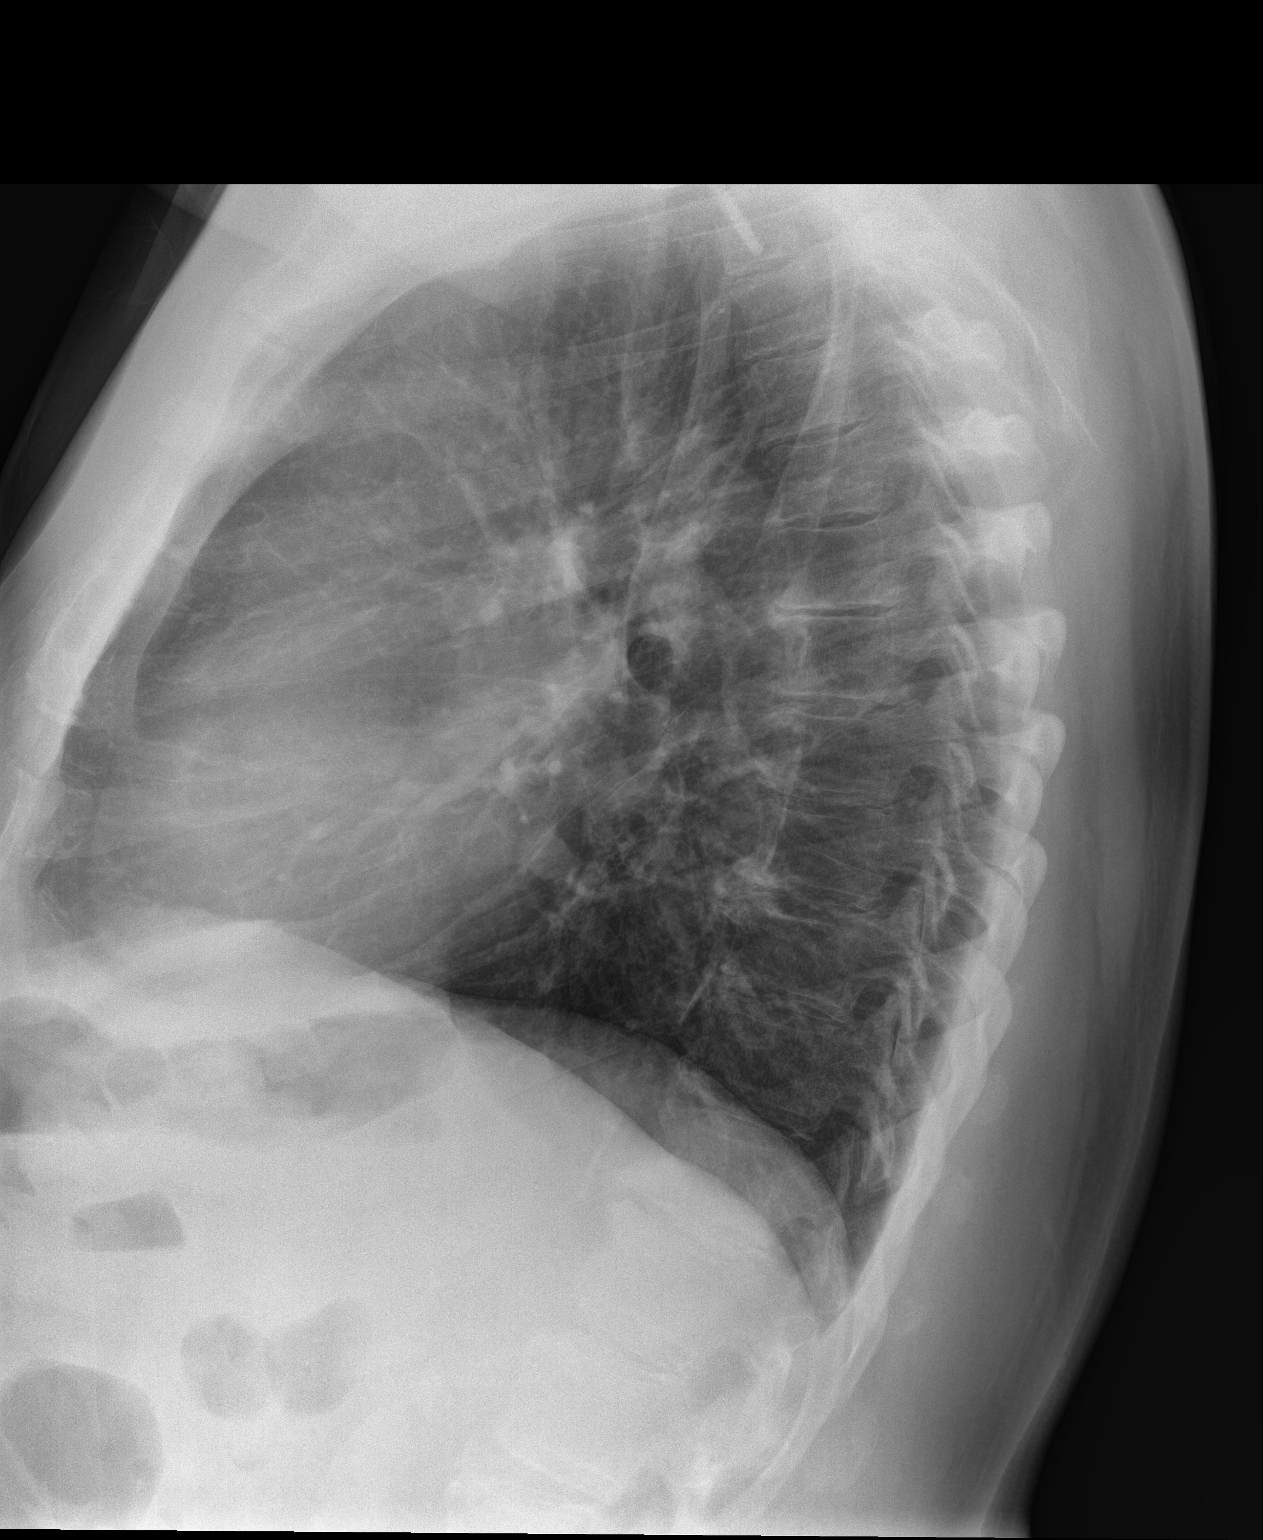

[3 of 3 positions shown; findings below may reference images not displayed]

FINDINGS: Lungs are adequately inflated and otherwise clear. Cardiomediastinal
silhouette is normal. Mild degenerative changes of the spine. Right
shoulder arthroplasty is present.
IMPRESSION: No active cardiopulmonary disease.

## 2022-08-05 ENCOUNTER — Encounter: Payer: Self-pay | Admitting: Emergency Medicine

## 2022-08-05 ENCOUNTER — Ambulatory Visit
Admission: EM | Admit: 2022-08-05 | Discharge: 2022-08-05 | Disposition: A | Discharge status: Home / Self Care | Payer: Medicare Other | Attending: Physician Assistant | Admitting: Physician Assistant

## 2022-08-05 DIAGNOSIS — U071 COVID-19: Secondary | ICD-10-CM | POA: Diagnosis not present

## 2022-08-05 DIAGNOSIS — R051 Acute cough: Secondary | ICD-10-CM | POA: Diagnosis not present

## 2022-08-05 LAB — RESP PANEL BY RT-PCR (RSV, FLU A&B, COVID)  RVPGX2
Influenza A by PCR: NEGATIVE
Influenza B by PCR: NEGATIVE
Resp Syncytial Virus by PCR: NEGATIVE
SARS Coronavirus 2 by RT PCR: POSITIVE — AB

## 2022-08-05 MED ORDER — NIRMATRELVIR/RITONAVIR (PAXLOVID)TABLET: 3.0000 | ORAL_TABLET | Freq: Two times a day (BID) | ORAL | 0 refills | Status: AC

## 2022-08-05 MED ORDER — PROMETHAZINE-DM 6.25-15 MG/5ML PO SYRP: 5.0000 mL | ORAL_SOLUTION | Freq: Four times a day (QID) | ORAL | 0 refills | Status: DC | PRN

## 2022-08-05 NOTE — ED Triage Notes (Signed)
Patient c/o cough, runny nose, and green drainage that started yesterday.  Patient denies fevers.

## 2022-08-05 NOTE — ED Provider Notes (Signed)
[facility]    CSN: [id number] Arrival date & time: 08/05/22  8416      History   Chief Complaint Chief Complaint  Patient presents with   Cough    HPI Richard Jensen is a 72 y.o. male presenting for cough and nasal congestion that began yesterday.  Reports cough is productive of light green sputum. Patient denies fever, fatigue, sore throat, chest pain, SOB, vomiting, diarrhea. Patient's wife has been ill for 1 week with similar symptoms.  No known COVID or flu exposure.  Taking over-the-counter DayQuil/NyQuil medication.  No other complaints.  HPI  Past Medical History:  Diagnosis Date   GERD (gastroesophageal reflux disease)    Hypercholesteremia    Hypertension    Myocardial infarct ([place])     There are no problems to display for this patient.   Past Surgical History:  Procedure Laterality Date   JOINT REPLACEMENT         Home Medications    Prior to Admission medications   Medication Sig Start Date End Date Taking? Authorizing Provider  amLODipine (NORVASC) 10 MG tablet Take 10 mg by mouth daily.   Yes [provider]  aspirin 81 MG EC tablet aspirin 81 mg tablet,delayed release  TAKE 1 TABLET BY MOUTH TWICE DAILY AFTER MEALS FOR 14 DAYS START DAY AFTER SURGERY   Yes [provider]  atorvastatin (LIPITOR) 80 MG tablet Take 80 mg by mouth daily.   Yes [provider]  enalapril (VASOTEC) 20 MG tablet Take 20 mg by mouth daily.   Yes [provider]  nirmatrelvir/ritonavir (PAXLOVID) 20 x 150 MG & 10 x '100MG'$  TABS Take 3 tablets by mouth 2 (two) times daily for 5 days. Patient GFR is 72. Take nirmatrelvir (150 mg) two tablets twice daily for 5 days and ritonavir (100 mg) one tablet twice daily for 5 days. 08/05/22 08/10/22 Yes Danton Clap, PA-C  promethazine-dextromethorphan (PROMETHAZINE-DM) 6.25-15 MG/5ML syrup Take 5 mLs by mouth 4 (four) times daily as needed. 08/05/22  Yes Danton Clap, PA-C  albuterol (VENTOLIN  HFA) 108 (90 Base) MCG/ACT inhaler Inhale 1-2 puffs into the lungs every 6 (six) hours as needed for wheezing or shortness of breath. 07/06/21   Margarette Canada, NP  benzonatate (TESSALON) 100 MG capsule Take 2 capsules (200 mg total) by mouth every 8 (eight) hours. 07/06/21   Margarette Canada, NP  ipratropium (ATROVENT) 0.06 % nasal spray Place 2 sprays into both nostrils 4 (four) times daily. 07/06/21   Margarette Canada, NP  naproxen (NAPROSYN) 500 MG tablet Take 500 mg by mouth 2 (two) times daily with a meal.    [provider]  ranitidine (ZANTAC) 150 MG capsule Take 150 mg by mouth 2 (two) times daily.    [provider]  fluticasone (FLONASE) 50 MCG/ACT nasal spray Place 1 spray into both nostrils daily. 04/14/18 08/10/20  Marylene Land, NP    Family History Family History  Problem Relation Age of Onset   Irregular heart beat Mother    Diabetes Father     Social History Social History   Tobacco Use   Smoking status: Never   Smokeless tobacco: Never  Vaping Use   Vaping Use: Never used  Substance Use Topics   Alcohol use: Yes    Alcohol/week: 2.0 standard drinks of alcohol    Types: 2 Cans of beer per week    Comment: beers per day   Drug use: Never     Allergies  Patient has no known allergies.   Review of Systems Review of Systems  Constitutional:  Positive for fatigue. Negative for fever.  HENT:  Positive for congestion, rhinorrhea and sinus pressure. Negative for sinus pain and sore throat.   Respiratory:  Positive for cough. Negative for shortness of breath.   Gastrointestinal:  Negative for abdominal pain, diarrhea, nausea and vomiting.  Musculoskeletal:  Negative for myalgias.  Neurological:  Negative for weakness, light-headedness and headaches.  Hematological:  Negative for adenopathy.     Physical Exam Triage Vital Signs ED Triage Vitals  Enc Vitals Group     BP 08/05/22 0931 (!) 145/66     Pulse Rate 08/05/22 0931 72     Resp 08/05/22 0931 15      Temp 08/05/22 0931 98.3 F (36.8 C)     Temp Source 08/05/22 0931 Oral     SpO2 08/05/22 0931 97 %     Weight 08/05/22 0929 215 lb (97.5 kg)     Height 08/05/22 0929 '5\' 11"'$  (1.803 m)     Head Circumference --      Peak Flow --      Pain Score 08/05/22 0929 3     Pain Loc --      Pain Edu? --      Excl. in [place]? --    No data found.  Updated Vital Signs BP (!) 145/66 (BP Location: Right Arm)   Pulse 72   Temp 98.3 F (36.8 C) (Oral)   Resp 15   Ht '5\' 11"'$  (1.803 m)   Wt 215 lb (97.5 kg)   SpO2 97%   BMI 29.99 kg/m     Physical Exam Vitals and nursing note reviewed.  Constitutional:      General: He is not in acute distress.    Appearance: Normal appearance. He is well-developed. He is not ill-appearing.  HENT:     Head: Normocephalic and atraumatic.     Nose: Congestion present.     Mouth/Throat:     Mouth: Mucous membranes are moist.     Pharynx: Oropharynx is clear.  Eyes:     General: No scleral icterus.    Conjunctiva/sclera: Conjunctivae normal.  Cardiovascular:     Rate and Rhythm: Normal rate and regular rhythm.     Heart sounds: Normal heart sounds.  Pulmonary:     Effort: Pulmonary effort is normal. No respiratory distress.     Breath sounds: Normal breath sounds.  Musculoskeletal:     Cervical back: Neck supple.  Skin:    General: Skin is warm and dry.     Capillary Refill: Capillary refill takes less than 2 seconds.  Neurological:     General: No focal deficit present.     Mental Status: He is alert. Mental status is at baseline.     Motor: No weakness.     Gait: Gait normal.  Psychiatric:        Mood and Affect: Mood normal.        Behavior: Behavior normal.      UC Treatments / Results  Labs (all labs ordered are listed, but only abnormal results are displayed) Labs Reviewed  RESP PANEL BY RT-PCR (RSV, FLU A&B, COVID)  [id number] - Abnormal; Notable for the following components:      Result Value   SARS Coronavirus 2 by RT PCR POSITIVE  (*)    All other components within normal limits    EKG   Radiology No results found.  Procedures  Procedures (including critical care time)  Medications Ordered in UC Medications - No data to display  Initial Impression / Assessment and Plan / UC Course  I have reviewed the triage vital signs and the nursing notes.  Pertinent labs & imaging results that were available during my care of the patient were reviewed by me and considered in my medical decision making (see chart for details).   72 y/o male presents for cough and congestion since yesterday. Denies fever or known COVID/flu exposure.   Patient is afebrile and overall well appearing.  Mild nasal congestion on exam.  Throat is clear.  Chest clear to auscultation.  Respiratory panel obtained. +COVID.   Discussed lab results with patient.  Patient is agreeable to Paxlovid treatment.  Reviewed CMP from October 2023 which shows a GFR of 72.  Sent Paxlovid to pharmacy as well as Promethazine DM.  Encouraged following CDC guidelines.  Discussed isolation protocol and ED precautions.   Final Clinical Impressions(s) / UC Diagnoses   Final diagnoses:  COVID-19  Acute cough     Discharge Instructions      -COVID test is positive.  You need to isolate until Thursday and then wear a mask for 5 days.  You should reschedule your dentist appointment. - I have sent Paxlovid antiviral medicine to the pharmacy.  Start right away.  This is to hopefully prevent you from getting sicker and fight the virus.  I also sent cough medicine. - Increase rest and fluids. - Use your inhaler as needed for any shortness of breath if you feel that your breathing is worsening, you need to return or go to the emergency department.     ED Prescriptions     Medication Sig Dispense Auth. Provider   promethazine-dextromethorphan (PROMETHAZINE-DM) 6.25-15 MG/5ML syrup Take 5 mLs by mouth 4 (four) times daily as needed. 118 mL Danton Clap, PA-C    nirmatrelvir/ritonavir (PAXLOVID) 20 x 150 MG & 10 x '100MG'$  TABS Take 3 tablets by mouth 2 (two) times daily for 5 days. Patient GFR is 72. Take nirmatrelvir (150 mg) two tablets twice daily for 5 days and ritonavir (100 mg) one tablet twice daily for 5 days. 30 tablet Gretta Cool      PDMP not reviewed this encounter.   Danton Clap, PA-C 08/05/22 1025

## 2022-08-05 NOTE — Discharge Instructions (Addendum)
-  COVID test is positive.  You need to isolate until Thursday and then wear a mask for 5 days.  You should reschedule your dentist appointment. - I have sent Paxlovid antiviral medicine to the pharmacy.  Start right away.  This is to hopefully prevent you from getting sicker and fight the virus.  I also sent cough medicine. - Increase rest and fluids. - Use your inhaler as needed for any shortness of breath if you feel that your breathing is worsening, you need to return or go to the emergency department.

## 2022-11-20 ENCOUNTER — Ambulatory Visit
Admission: RE | Admit: 2022-11-20 | Discharge: 2022-11-20 | Disposition: A | Discharge status: Home / Self Care | Payer: Medicare Other | Source: Ambulatory Visit | Attending: Physician Assistant | Admitting: Physician Assistant

## 2022-11-20 VITALS — BP 134/67 | HR 65 | Temp 98.0°F

## 2022-11-20 DIAGNOSIS — R051 Acute cough: Secondary | ICD-10-CM

## 2022-11-20 DIAGNOSIS — J209 Acute bronchitis, unspecified: Secondary | ICD-10-CM | POA: Diagnosis not present

## 2022-11-20 MED ORDER — PREDNISONE 20 MG PO TABS: 40.0000 mg | ORAL_TABLET | Freq: Every day | ORAL | 0 refills | Status: AC

## 2022-11-20 MED ORDER — PROMETHAZINE-DM 6.25-15 MG/5ML PO SYRP: 5.0000 mL | ORAL_SOLUTION | Freq: Four times a day (QID) | ORAL | 0 refills | Status: DC | PRN

## 2022-11-20 NOTE — ED Provider Notes (Signed)
[facility]    CSN: [id number] Arrival date & time: 11/20/22  0906      History   Chief Complaint Chief Complaint  Patient presents with   Cough    Chest congestion & eye vessel burst. - Entered by patient    HPI Richard Jensen is a 72 y.o. male presenting for 4-day history of fatigue, cough, congestion.  He denies fever, wheezing or breathing difficulty, vomiting or diarrhea.  He reports that he recently returned from [place].  He states that he drove there and back.  Denies any sick contacts.  He has been taking OTC meds without relief.  He has a personal history of COVID about 3 months ago.  HPI  Past Medical History:  Diagnosis Date   GERD (gastroesophageal reflux disease)    Hypercholesteremia    Hypertension    Myocardial infarct ([facility])     There are no problems to display for this patient.   Past Surgical History:  Procedure Laterality Date   JOINT REPLACEMENT         Home Medications    Prior to Admission medications   Medication Sig Start Date End Date Taking? Authorizing Provider  predniSONE (DELTASONE) 20 MG tablet Take 2 tablets (40 mg total) by mouth daily for 5 days. 11/20/22 11/25/22 Yes Shirlee Latch, PA-C  albuterol (VENTOLIN HFA) 108 (90 Base) MCG/ACT inhaler Inhale 1-2 puffs into the lungs every 6 (six) hours as needed for wheezing or shortness of breath. 07/06/21   Becky Augusta, NP  amLODipine (NORVASC) 10 MG tablet Take 10 mg by mouth daily.    [provider]  aspirin 81 MG EC tablet aspirin 81 mg tablet,delayed release  TAKE 1 TABLET BY MOUTH TWICE DAILY AFTER MEALS FOR 14 DAYS START DAY AFTER SURGERY    [provider]  atorvastatin (LIPITOR) 80 MG tablet Take 80 mg by mouth daily.    [provider]  enalapril (VASOTEC) 20 MG tablet Take 20 mg by mouth daily.    [provider]  ipratropium (ATROVENT) 0.06 % nasal spray Place 2 sprays into both nostrils 4 (four) times daily. 07/06/21   Becky Augusta,  NP  naproxen (NAPROSYN) 500 MG tablet Take 500 mg by mouth 2 (two) times daily with a meal.    [provider]  promethazine-dextromethorphan (PROMETHAZINE-DM) 6.25-15 MG/5ML syrup Take 5 mLs by mouth 4 (four) times daily as needed. 11/20/22   Shirlee Latch, PA-C  ranitidine (ZANTAC) 150 MG capsule Take 150 mg by mouth 2 (two) times daily.    [provider]  fluticasone (FLONASE) 50 MCG/ACT nasal spray Place 1 spray into both nostrils daily. 04/14/18 08/10/20  Renford Dills, NP    Family History Family History  Problem Relation Age of Onset   Irregular heart beat Mother    Diabetes Father     Social History Social History   Tobacco Use   Smoking status: Never   Smokeless tobacco: Never  Vaping Use   Vaping Use: Never used  Substance Use Topics   Alcohol use: Yes    Alcohol/week: 2.0 standard drinks of alcohol    Types: 2 Cans of beer per week    Comment: beers per day   Drug use: Never     Allergies   Patient has no known allergies.   Review of Systems Review of Systems  Constitutional:  Positive for fatigue. Negative for fever.  HENT:  Positive for congestion and rhinorrhea. Negative for sinus pressure, sinus  pain and sore throat.   Respiratory:  Positive for cough. Negative for shortness of breath and wheezing.   Gastrointestinal:  Negative for abdominal pain, diarrhea, nausea and vomiting.  Musculoskeletal:  Negative for myalgias.  Neurological:  Negative for weakness, light-headedness and headaches.  Hematological:  Negative for adenopathy.     Physical Exam Triage Vital Signs ED Triage Vitals  Enc Vitals Group     BP 11/20/22 0926 134/67     Pulse Rate 11/20/22 0925 65     Resp --      Temp 11/20/22 0925 98 F (36.7 C)     Temp Source 11/20/22 0925 Oral     SpO2 11/20/22 0925 98 %     Weight --      Height --      Head Circumference --      Peak Flow --      Pain Score 11/20/22 0925 0     Pain Loc --      Pain Edu? --       Excl. in [place]? --    No data found.  Updated Vital Signs BP 134/67   Pulse 65   Temp 98 F (36.7 C) (Oral)   SpO2 98%      Physical Exam Vitals and nursing note reviewed.  Constitutional:      General: He is not in acute distress.    Appearance: Normal appearance. He is well-developed. He is not ill-appearing.  HENT:     Head: Normocephalic and atraumatic.     Nose: Congestion present.     Mouth/Throat:     Mouth: Mucous membranes are moist.     Pharynx: Oropharynx is clear.  Eyes:     General: No scleral icterus.    Conjunctiva/sclera: Conjunctivae normal.  Cardiovascular:     Rate and Rhythm: Normal rate and regular rhythm.     Heart sounds: Normal heart sounds.  Pulmonary:     Effort: Pulmonary effort is normal. No respiratory distress.     Breath sounds: Rhonchi (few scattered rhonchi of upper lung fields) present.  Musculoskeletal:     Cervical back: Neck supple.  Skin:    General: Skin is warm and dry.     Capillary Refill: Capillary refill takes less than 2 seconds.  Neurological:     General: No focal deficit present.     Mental Status: He is alert. Mental status is at baseline.     Motor: No weakness.     Gait: Gait normal.  Psychiatric:        Mood and Affect: Mood normal.        Behavior: Behavior normal.      UC Treatments / Results  Labs (all labs ordered are listed, but only abnormal results are displayed) Labs Reviewed - No data to display  EKG   Radiology No results found.  Procedures Procedures (including critical care time)  Medications Ordered in UC Medications - No data to display  Initial Impression / Assessment and Plan / UC Course  I have reviewed the triage vital signs and the nursing notes.  Pertinent labs & imaging results that were available during my care of the patient were reviewed by me and considered in my medical decision making (see chart for details).   72 year old male presents for cough, congestion, fatigue x 4  days after returning from [place].  No sick contacts.  History of COVID-19 in the past 3 months.  Vitals normal stable patient is overall well-appearing.  On exam he has nasal congestion.  Throat is clear.  Few scattered rhonchi of upper lung fields.  He is in no distress.  Patient denies history of COPD or lung disease but he does occasionally get bronchitis.  Will treat at this time with prednisone and Promethazine DM for suspected viral bronchitis.  Reviewed return and ED precautions.   Final Clinical Impressions(s) / UC Diagnoses   Final diagnoses:  Acute bronchitis, unspecified organism  Acute cough     Discharge Instructions      -You have viral bronchitis which can last a couple weeks.  I sent a corticosteroid to the pharmacy which should be helpful as well as cough medicine. - Increase rest and fluids. - If at any point you develop a fever or start to feel a lot worse or if shortness of breath and please return to be reevaluated.     ED Prescriptions     Medication Sig Dispense Auth. Provider   promethazine-dextromethorphan (PROMETHAZINE-DM) 6.25-15 MG/5ML syrup Take 5 mLs by mouth 4 (four) times daily as needed. 118 mL Eusebio Friendly B, PA-C   predniSONE (DELTASONE) 20 MG tablet Take 2 tablets (40 mg total) by mouth daily for 5 days. 10 tablet Gareth Morgan      PDMP not reviewed this encounter.   Shirlee Latch, PA-C 11/20/22 [id number]

## 2022-11-20 NOTE — Discharge Instructions (Addendum)
-  You have viral bronchitis which can last a couple weeks.  I sent a corticosteroid to the pharmacy which should be helpful as well as cough medicine. - Increase rest and fluids. - If at any point you develop a fever or start to feel a lot worse or if shortness of breath and please return to be reevaluated.

## 2022-11-20 NOTE — ED Triage Notes (Signed)
Pt c/o congestion, head pressure onset last Thursday, pt states he has been taking some OTC meds but not helping.

## 2022-11-28 ENCOUNTER — Ambulatory Visit (INDEPENDENT_AMBULATORY_CARE_PROVIDER_SITE_OTHER): Payer: Medicare Other

## 2022-11-28 ENCOUNTER — Ambulatory Visit
Admission: RE | Admit: 2022-11-28 | Discharge: 2022-11-28 | Disposition: A | Discharge status: Home / Self Care | Payer: Medicare Other | Source: Ambulatory Visit | Attending: Emergency Medicine | Admitting: Emergency Medicine

## 2022-11-28 VITALS — BP 146/70 | HR 65 | Temp 98.2°F | Ht 71.0 in | Wt 220.0 lb

## 2022-11-28 DIAGNOSIS — R051 Acute cough: Secondary | ICD-10-CM

## 2022-11-28 DIAGNOSIS — J0140 Acute pansinusitis, unspecified: Secondary | ICD-10-CM

## 2022-11-28 MED ORDER — AEROCHAMBER MV MISC: 1 refills | Status: DC

## 2022-11-28 MED ORDER — PROMETHAZINE-DM 6.25-15 MG/5ML PO SYRP: 5.0000 mL | ORAL_SOLUTION | Freq: Four times a day (QID) | ORAL | 0 refills | Status: DC | PRN

## 2022-11-28 MED ORDER — IPRATROPIUM BROMIDE 0.06 % NA SOLN: 2.0000 | Freq: Four times a day (QID) | NASAL | 0 refills | Status: DC

## 2022-11-28 MED ORDER — DOXYCYCLINE HYCLATE 100 MG PO CAPS: 100.0000 mg | ORAL_CAPSULE | Freq: Two times a day (BID) | ORAL | 0 refills | Status: AC

## 2022-11-28 NOTE — ED Triage Notes (Signed)
Pt c/o cough, wheezing, chest congestion x2weeks  Pt was seen 1 week ago and was given prednisone

## 2022-11-28 NOTE — ED Provider Notes (Signed)
HPI  SUBJECTIVE:  Richard Jensen is a 72 y.o. male who presents with 2 weeks of nasal, chest congestion, sinus pain and pressure, headache, postnasal drip, wheezing, chest soreness secondary to coughing.  Reports cough productive of green sputum and greenish rhinorrhea.  He is not getting worse, but is not improving.  Reports some shortness of breath with going up stairs only.  No other shortness of breath.  No fevers, shortness of breath at rest, facial swelling, upper dental pain.  Patient was seen here 8 days ago for fatigue, cough, congestion.  He was thought to have bronchitis, sent home with prednisone and Promethazine DM.  He has been taking this along DayQuil and NyQuil with improvement in his symptoms.  No aggravating factors.  No antibiotics in the past 3 months.  No antipyretic in the past 6 hours. He has a past medical history of COVID in February 24, GERD, hypercholesterolemia, hypertension, MI.  No history of pulmonary disease.  PCP: [facility].  Past Medical History:  Diagnosis Date   GERD (gastroesophageal reflux disease)    Hypercholesteremia    Hypertension    Myocardial infarct [facility])     Past Surgical History:  Procedure Laterality Date   JOINT REPLACEMENT      Family History  Problem Relation Age of Onset   Irregular heart beat Mother    Diabetes Father     Social History   Tobacco Use   Smoking status: Never   Smokeless tobacco: Never  Vaping Use   Vaping Use: Never used  Substance Use Topics   Alcohol use: Yes    Alcohol/week: 2.0 standard drinks of alcohol    Types: 2 Cans of beer per week    Comment: beers per day   Drug use: Never    No current facility-administered medications for this encounter.  Current Outpatient Medications:    amLODipine (NORVASC) 10 MG tablet, Take 10 mg by mouth daily., Disp: , Rfl:    aspirin 81 MG EC tablet, aspirin 81 mg tablet,delayed release  TAKE 1 TABLET BY MOUTH TWICE DAILY AFTER MEALS FOR 14 DAYS START DAY  AFTER SURGERY, Disp: , Rfl:    atorvastatin (LIPITOR) 80 MG tablet, Take 80 mg by mouth daily., Disp: , Rfl:    doxycycline (VIBRAMYCIN) 100 MG capsule, Take 1 capsule (100 mg total) by mouth 2 (two) times daily for 10 days., Disp: 20 capsule, Rfl: 0   enalapril (VASOTEC) 20 MG tablet, Take 20 mg by mouth daily., Disp: , Rfl:    ipratropium (ATROVENT) 0.06 % nasal spray, Place 2 sprays into both nostrils 4 (four) times daily., Disp: 15 mL, Rfl: 0   naproxen (NAPROSYN) 500 MG tablet, Take 500 mg by mouth 2 (two) times daily with a meal., Disp: , Rfl:    promethazine-dextromethorphan (PROMETHAZINE-DM) 6.25-15 MG/5ML syrup, Take 5 mLs by mouth 4 (four) times daily as needed for cough., Disp: 118 mL, Rfl: 0   ranitidine (ZANTAC) 150 MG capsule, Take 150 mg by mouth 2 (two) times daily., Disp: , Rfl:    Spacer/Aero-Holding Chambers (AEROCHAMBER MV) inhaler, Use as instructed, Disp: 1 each, Rfl: 1  No Known Allergies   ROS  As noted in HPI.   Physical Exam  BP (!) 146/70 (BP Location: Left Arm)   Pulse 65   Temp 98.2 F (36.8 C) (Oral)   Ht 5\' 11"  (1.803 m)   Wt 99.8 kg   SpO2 95%   BMI 30.68 kg/m   Constitutional: Well developed,  well nourished, no acute distress Eyes:  EOMI, conjunctiva normal bilaterally HENT: Normocephalic, atraumatic,mucus membranes moist.  Positive maxillary, frontal sinus tenderness.  Purulent nasal congestion.  Normal turbinates.  Normal oropharynx.  Positive cobblestoning.  No postnasal drip. Respiratory: Normal inspiratory effort, scattered expiratory wheezing right side.  Positive anterior, lateral chest wall tenderness. Cardiovascular: Normal rate, regular rhythm, no murmurs rubs or gallops GI: nondistended skin: No rash, skin intact Musculoskeletal: no deformities Neurologic: Alert & oriented x 3, no focal neuro deficits Psychiatric: Speech and behavior appropriate   ED Course   Medications - No data to display  Orders Placed This Encounter   Procedures   DG Chest 2 View    Standing Status:   Standing    Number of Occurrences:   1    Order Specific Question:   Reason for Exam (SYMPTOM  OR DIAGNOSIS REQUIRED)    Answer:   Cough, wheezing 2 weeks rule out pneumonia    No results found for this or any previous visit (from the past 24 hour(s)). DG Chest 2 View  Result Date: 11/28/2022 CLINICAL DATA:  Cough, wheezing 2 weeks rule out pneumonia EXAM: CHEST - 2 VIEW COMPARISON:  Chest x-ray 07/15/2021 FINDINGS: The heart and mediastinal contours are unchanged. Aortic calcification. No focal consolidation. No pulmonary edema. No pleural effusion. No pneumothorax. No acute osseous abnormality. Right shoulder reversed total arthroplasty partially visualized. IMPRESSION: 1. No active cardiopulmonary disease. 2.  Aortic Atherosclerosis (ICD10-I70.0). Electronically Signed   By: Tish Frederickson M.D.   On: 11/28/2022 17:24    ED Clinical Impression  1. Acute non-recurrent pansinusitis   2. Acute cough      ED Assessment/Plan     Previous records reviewed.  As noted in HPI.  Will get chest x-ray today as a baseline, however, I am sending him home on doxycycline for 10 days for sinusitis.  This will also cover any possible pneumonia that may not be showing up on x-ray today.  Home with an albuterol inhaler with a spacer every 4-6 hours as needed, saline nasal irrigation, Mucinex, Atrovent nasal spray.  Will refill his Promethazine DM.  Discussed with him that this cough can last for several weeks.  Follow-up with PCP as needed.  Written ER return precautions given.  Reviewed imaging independenty.  Normal chest x-ray.  No pneumonia.  See radiology report for full details.  Chest x-ray negative.  Patient with a acute pansinusitis and cough from recent respiratory infection/bronchitis.  Plan as above.  Discussed  imaging, MDM, treatment plan, and plan for follow-up with patient. Discussed sn/sx that should prompt return to the ED. patient  agrees with plan.   Meds ordered this encounter  Medications   doxycycline (VIBRAMYCIN) 100 MG capsule    Sig: Take 1 capsule (100 mg total) by mouth 2 (two) times daily for 10 days.    Dispense:  20 capsule    Refill:  0   promethazine-dextromethorphan (PROMETHAZINE-DM) 6.25-15 MG/5ML syrup    Sig: Take 5 mLs by mouth 4 (four) times daily as needed for cough.    Dispense:  118 mL    Refill:  0   ipratropium (ATROVENT) 0.06 % nasal spray    Sig: Place 2 sprays into both nostrils 4 (four) times daily.    Dispense:  15 mL    Refill:  0   Spacer/Aero-Holding Chambers (AEROCHAMBER MV) inhaler    Sig: Use as instructed    Dispense:  1 each    Refill:  1      *  This clinic note was created using Scientist, clinical (histocompatibility and immunogenetics). Therefore, there may be occasional mistakes despite careful proofreading.  ?    Domenick Gong, MD 11/28/22 1910

## 2022-11-28 NOTE — Discharge Instructions (Addendum)
Your chest x-ray was negative for pneumonia.  Finish the doxycycline, even if you feel better.  Saline nasal irrigation with a NeilMed sinus rinse and distilled water as often as you want, Mucinex, Atrovent nasal spray will help with the nasal congestion, postnasal drip and make the antibiotics more effective.  2 puffs from your albuterol inhaler using your spacer every 4-6 hours as needed for coughing, wheezing, shortness of breath.  Promethazine DM as needed for cough.  Stop the DayQuil, NyQuil.  You may have a persistent cough for several weeks after finishing the antibiotics.  Please follow-up with your primary care provider as needed.  Go to the ER for fevers above 100.4, if you get worse, have shortness of breath at rest, or for any other concerns.

## 2023-01-20 IMAGING — CR DG CHEST 2V
2 series · 2 of 2 positions shown · non-contrast
Comparison: Chest x-ray dated August 28, 2020.

CLINICAL DATA: Worsening productive cough.

EXAM:
CHEST - 2 VIEW

[chest pa]
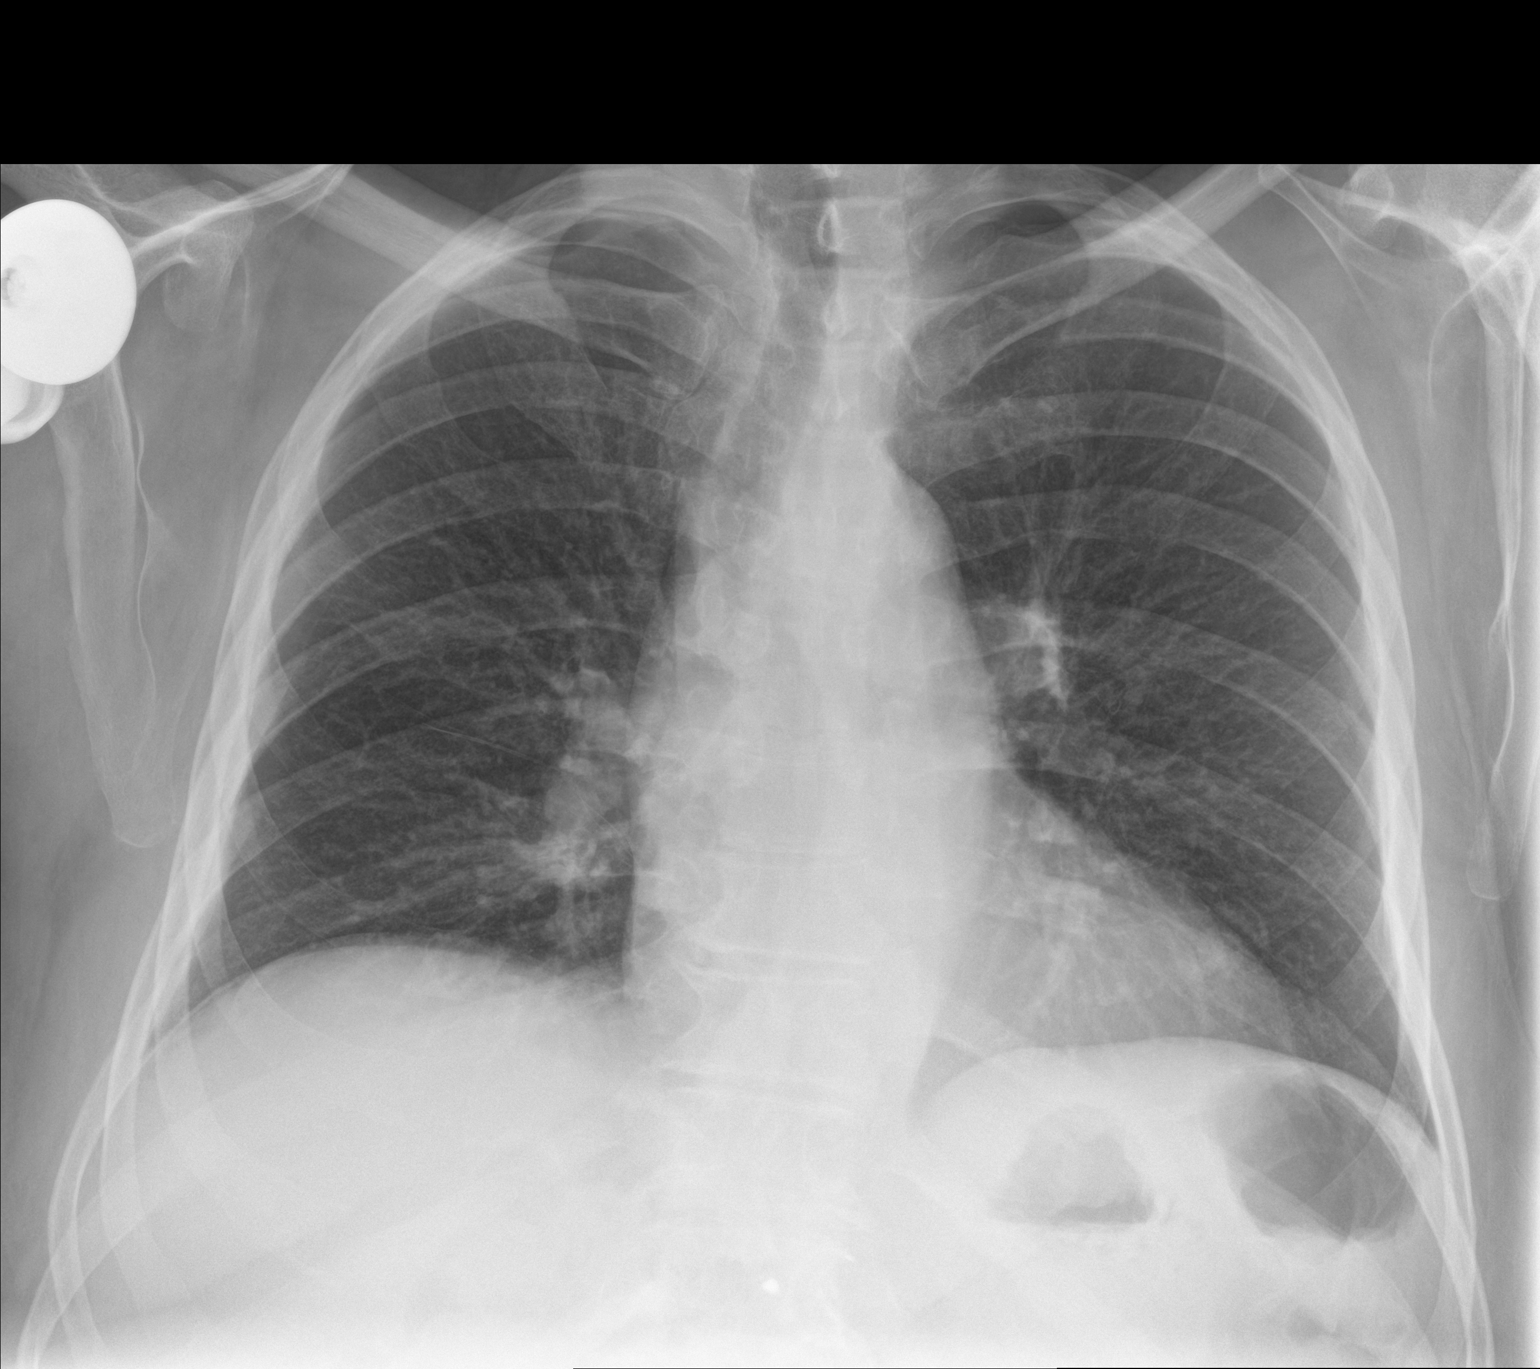

[chest lat]
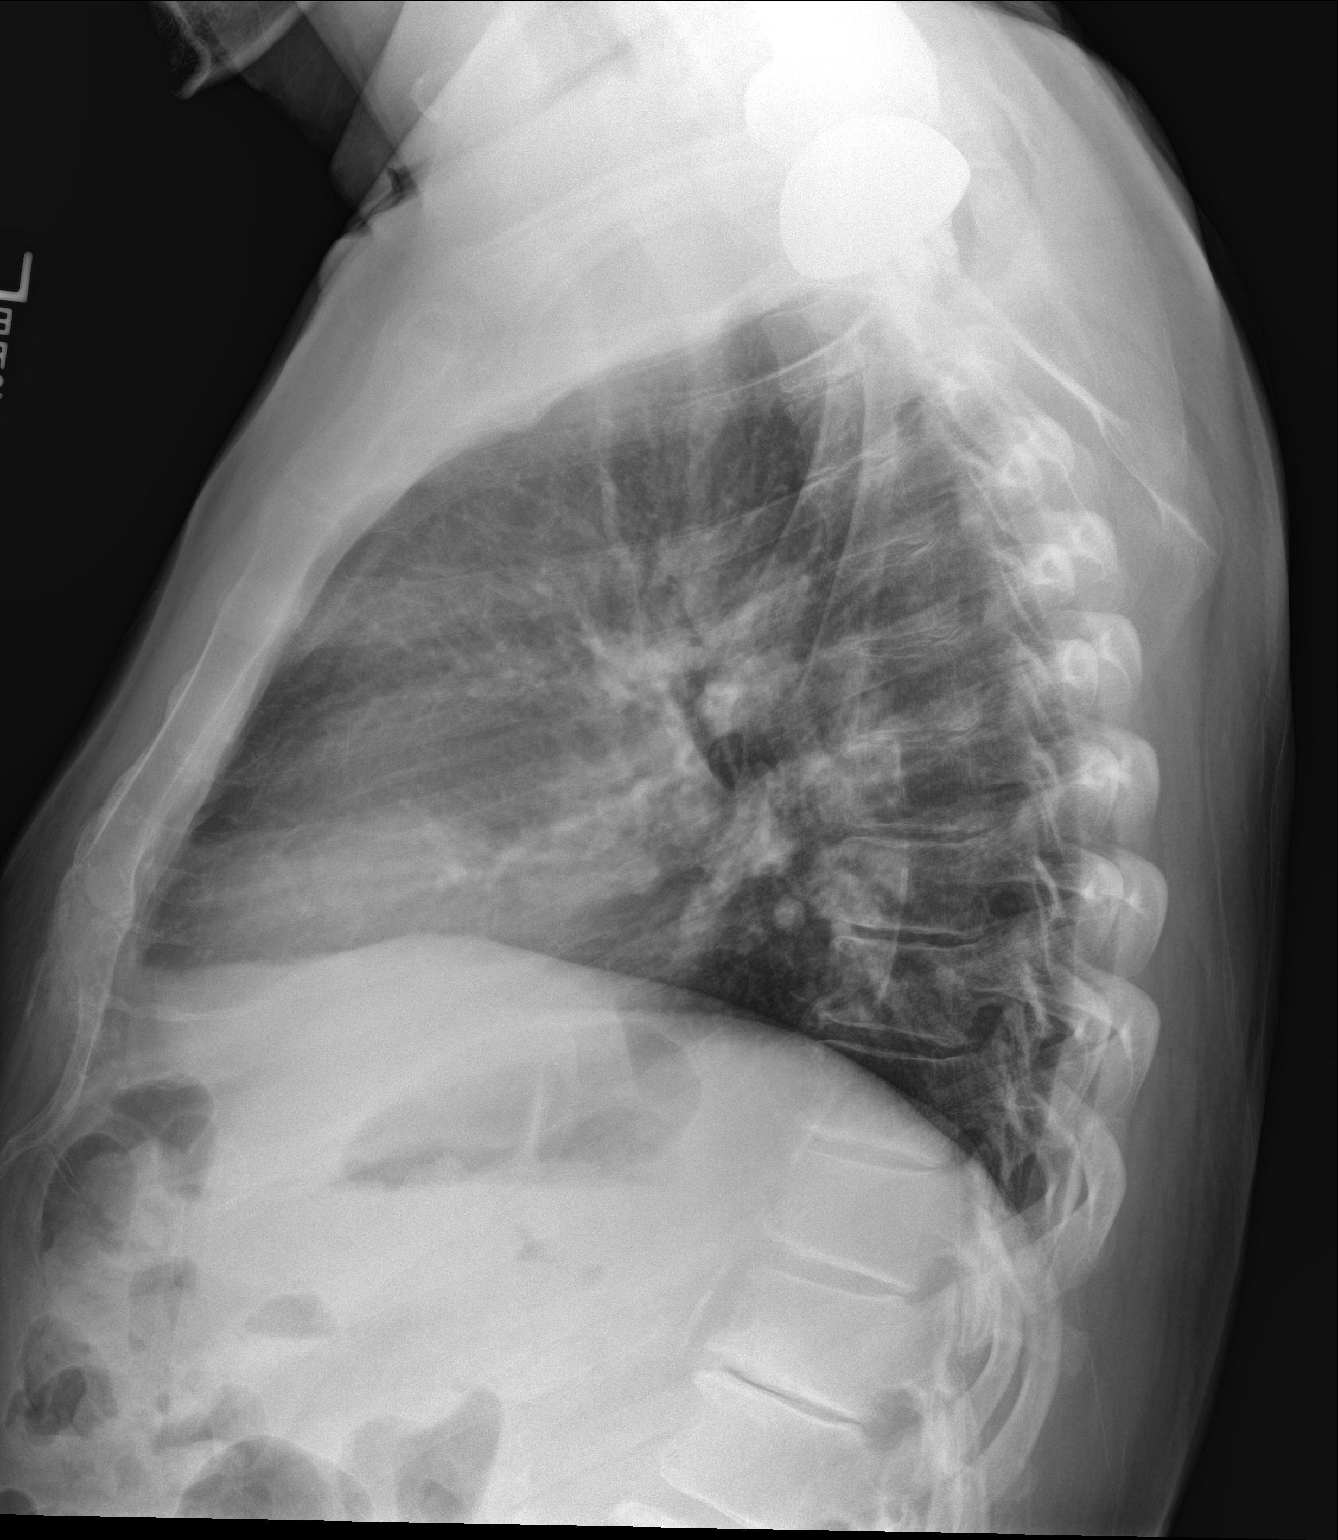

[2 of 2 positions shown; findings below may reference images not displayed]

FINDINGS: The heart size and mediastinal contours are within normal limits.
Both lungs are clear. The visualized skeletal structures are
unremarkable.
IMPRESSION: No active cardiopulmonary disease.

## 2023-03-13 ENCOUNTER — Ambulatory Visit
Admission: RE | Admit: 2023-03-13 | Discharge: 2023-03-13 | Disposition: A | Discharge status: Home / Self Care | Payer: Medicare Other | Source: Ambulatory Visit

## 2023-03-13 VITALS — BP 156/71 | HR 78 | Temp 97.9°F | Resp 17 | Wt 218.0 lb

## 2023-03-13 DIAGNOSIS — J069 Acute upper respiratory infection, unspecified: Secondary | ICD-10-CM | POA: Diagnosis not present

## 2023-03-13 LAB — SARS CORONAVIRUS 2 BY RT PCR: SARS Coronavirus 2 by RT PCR: NEGATIVE

## 2023-03-13 MED ORDER — DOXYCYCLINE HYCLATE 100 MG PO CAPS
100.0000 mg | ORAL_CAPSULE | Freq: Two times a day (BID) | ORAL | 0 refills | Status: AC
Start: 2023-03-13 — End: 2023-03-20

## 2023-03-13 MED ORDER — PREDNISONE 20 MG PO TABS
40.0000 mg | ORAL_TABLET | Freq: Every day | ORAL | 0 refills | Status: AC
Start: 2023-03-13 — End: 2023-03-18

## 2023-03-13 MED ORDER — PROMETHAZINE-DM 6.25-15 MG/5ML PO SYRP
5.0000 mL | ORAL_SOLUTION | Freq: Four times a day (QID) | ORAL | 0 refills | Status: DC | PRN
Start: 2023-03-13 — End: 2024-01-24

## 2023-03-13 NOTE — Discharge Instructions (Signed)
The most likely cause of your symptoms is a viral infection.  However, given your lung sounds, I am also concerned about a possible bacterial infection.  I am prescribing you both with an antibiotic, doxycycline, as well as a prednisone which will help relieve any bronchial constriction.  You may use the inhaler that was previously prescribed as well.  Please follow-up with your primary care provider in about 2 weeks to verify that you are symptoms have recovered including resolution of bronchial constriction.

## 2023-03-13 NOTE — ED Triage Notes (Signed)
Sx started 3 days ago.  Coughing,runny nose,congestion. Took otc meds that helped. Patient sates that he's coughing up green phlegm.

## 2023-03-13 NOTE — ED Provider Notes (Signed)
[facility]    CSN: [id number] Arrival date & time: 03/13/23  1150      History   Chief Complaint Chief Complaint  Patient presents with   Cough    Entered by patient    HPI Richard Jensen is a 72 y.o. male.    Cough  Patient presents to urgent care with symptoms of coughing, runny nose, congestion, x 3 days.  Taking over-the-counter medication that has provided some relief.  Expresses concern that he is now coughing up "green phlegm".  Patient denies history of asthma or COPD.  He does endorse a history of "bronchitis".  Review of the patient's chart shows urgent care visit on 11/20/2022 where the patient was diagnosed with acute bronchitis and treated with a course of prednisone.  Chest x-ray on that date showed no focal consolidation or active cardiopulmonary disease.  It did show aortic atherosclerosis.  Past Medical History:  Diagnosis Date   GERD (gastroesophageal reflux disease)    Hypercholesteremia    Hypertension    Myocardial infarct (HCC)     There are no problems to display for this patient.   Past Surgical History:  Procedure Laterality Date   JOINT REPLACEMENT         Home Medications    Prior to Admission medications   Medication Sig Start Date End Date Taking? Authorizing Provider  amLODipine (NORVASC) 10 MG tablet Take 10 mg by mouth daily.   Yes [provider]  aspirin 81 MG EC tablet aspirin 81 mg tablet,delayed release  TAKE 1 TABLET BY MOUTH TWICE DAILY AFTER MEALS FOR 14 DAYS START DAY AFTER SURGERY   Yes [provider]  atorvastatin (LIPITOR) 80 MG tablet Take 80 mg by mouth daily.   Yes [provider]  enalapril (VASOTEC) 20 MG tablet Take 20 mg by mouth daily.   Yes [provider]  fluocinonide (LIDEX) 0.05 % external solution  06/23/22  Yes [provider]  ipratropium (ATROVENT) 0.06 % nasal spray Place 2 sprays into both nostrils 4 (four) times daily. 11/28/22  Yes Domenick Gong, MD  naproxen (NAPROSYN) 500 MG tablet Take 500 mg by mouth 2 (two) times daily with a meal.   Yes [provider]  promethazine-dextromethorphan (PROMETHAZINE-DM) 6.25-15 MG/5ML syrup Take 5 mLs by mouth 4 (four) times daily as needed for cough. 11/28/22   Domenick Gong, MD  ranitidine (ZANTAC) 150 MG capsule Take 150 mg by mouth 2 (two) times daily.    [provider]  Spacer/Aero-Holding Chambers (AEROCHAMBER MV) inhaler Use as instructed 11/28/22   Domenick Gong, MD  fluticasone [facility]) 50 MCG/ACT nasal spray Place 1 spray into both nostrils daily. 04/14/18 08/10/20  Renford Dills, NP    Family History Family History  Problem Relation Age of Onset   Irregular heart beat Mother    Diabetes Father     Social History Social History   Tobacco Use   Smoking status: Never   Smokeless tobacco: Never  Vaping Use   Vaping status: Never Used  Substance Use Topics   Alcohol use: Yes    Alcohol/week: 2.0 standard drinks of alcohol    Types: 2 Cans of beer per week    Comment: beers per day   Drug use: Never     Allergies   Patient has no known allergies.   Review of Systems Review of Systems  Respiratory:  Positive for cough.      Physical Exam Triage Vital Signs ED Triage Vitals  Encounter Vitals Group     BP 03/13/23 1201 (!) 156/71     Systolic BP Percentile --      Diastolic BP Percentile --      Pulse Rate 03/13/23 1201 78     Resp 03/13/23 1201 17     Temp 03/13/23 1201 97.9 F (36.6 C)     Temp Source 03/13/23 1201 Oral     SpO2 03/13/23 1201 96 %     Weight 03/13/23 1159 218 lb (98.9 kg)     Height --      Head Circumference --      Peak Flow --      Pain Score 03/13/23 1159 0     Pain Loc --      Pain Education --      Exclude from Growth Chart --    No data found.  Updated Vital Signs BP (!) 156/71 (BP Location: Right Arm)   Pulse 78   Temp 97.9 F (36.6 C) (Oral)   Resp 17   Wt 218 lb (98.9 kg)   SpO2 96%    BMI 30.40 kg/m   Visual Acuity Right Eye Distance:   Left Eye Distance:   Bilateral Distance:    Right Eye Near:   Left Eye Near:    Bilateral Near:     Physical Exam Vitals reviewed.  Constitutional:      Appearance: Normal appearance. He is ill-appearing.  Cardiovascular:     Rate and Rhythm: Normal rate and regular rhythm.     Heart sounds: Normal heart sounds.  Pulmonary:     Effort: Pulmonary effort is normal.     Breath sounds: Wheezing and rhonchi present.  Skin:    General: Skin is warm and dry.  Neurological:     General: No focal deficit present.     Mental Status: He is alert and oriented to person, place, and time.  Psychiatric:        Mood and Affect: Mood normal.        Behavior: Behavior normal.      UC Treatments / Results  Labs (all labs ordered are listed, but only abnormal results are displayed) Labs Reviewed - No data to display  EKG   Radiology No results found.  Procedures Procedures (including critical care time)  Medications Ordered in UC Medications - No data to display  Initial Impression / Assessment and Plan / UC Course  I have reviewed the triage vital signs and the nursing notes.  Pertinent labs & imaging results that were available during my care of the patient were reviewed by me and considered in my medical decision making (see chart for details).   Richard Jensen is a 72 y.o. male presenting with upper respiratory symptoms including cough. Patient is afebrile without recent antipyretics, satting well on room air. Overall is ill appearing though non-toxic, well hydrated, without respiratory distress. Pulmonary exam is remarkable for wheezing in all lobes. RRR without murmurs, rubs, gallops.  Reviewed relevant chart history.   Given duration of patient's symptoms, viral etiology is most likely.  However given adventitious breath sounds, I am concerned with a possible bacterial process.  Will prescribe prednisone to relieve  bronchial constriction and treat him with doxycycline to cover strep as well as atypical PNA.  He is requesting cough syrup which I will prescribe.  Previously tolerated promethazine-DM.  Counseled patient on potential for adverse effects with medications prescribed/recommended today, ER and return-to-clinic precautions discussed, patient verbalized understanding and  agreement with care plan.  Final Clinical Impressions(s) / UC Diagnoses   Final diagnoses:  None   Discharge Instructions   None    ED Prescriptions   None    PDMP not reviewed this encounter.   Charma Igo, [place] 03/13/23 1213

## 2023-05-17 ENCOUNTER — Encounter: Payer: Self-pay | Admitting: Emergency Medicine

## 2023-05-17 ENCOUNTER — Ambulatory Visit: Payer: Medicare Other

## 2023-05-17 ENCOUNTER — Ambulatory Visit
Admission: EM | Admit: 2023-05-17 | Discharge: 2023-05-17 | Disposition: A | Discharge status: Home / Self Care | Payer: Medicare Other | Attending: Physician Assistant | Admitting: Physician Assistant

## 2023-05-17 DIAGNOSIS — R0781 Pleurodynia: Secondary | ICD-10-CM | POA: Diagnosis not present

## 2023-05-17 DIAGNOSIS — W19XXXA Unspecified fall, initial encounter: Secondary | ICD-10-CM | POA: Diagnosis not present

## 2023-05-17 DIAGNOSIS — S0083XA Contusion of other part of head, initial encounter: Secondary | ICD-10-CM

## 2023-05-17 NOTE — Discharge Instructions (Addendum)
-  I did not see any fractures on your x-rays but I will call you if the radiologist determines that you have any fractures or other acute abnormalities on your imaging. - Ice your face and ribs.  Take Tylenol and or Aleve for discomfort. - If you do indeed have a rib fracture the treatment is essentially the same with trying to control the pain. - If you have any acute worsening of your pain you should go to the emergency department.  If you have any increased difficulty breathing please go to the ER. - Consider following up with your primary care provider for reevaluation if your symptoms are not improving. - Go to emergency department or call 911 if you have any severe/significant headaches, vision changes, nausea/vomiting, numbness/tingling or weakness, confusion, difficulty walking, etc.  There is to be signs of a major head injury.

## 2023-05-17 NOTE — ED Triage Notes (Signed)
Pt tripped over a shoe at home and fell face first on the floor. Pt denies loss of consciousness. He has an abrasion on his left cheek and c/o left side rib pain.

## 2023-05-17 NOTE — ED Provider Notes (Signed)
[facility]    CSN: [id number] Arrival date & time: 05/17/23  1100      History   Chief Complaint Chief Complaint  Patient presents with   Fall    HPI Richard Jensen is a 72 y.o. male with history of GERD, HTN, HLD, and previous MI.  Patient presents for injuries secondary to an accidental fall this morning. Patient reports tripping over his wife's shoes and falling onto his face and left side.  He states that he fell onto the floor.  Reports mild pain of left cheek with contusion and left chest/rib pain.  Increased discomfort with breathing.  Denies shortness of breath.  No bruising or wounds on his chest.  Denies LOC, dizziness, headaches, nausea/vomting.  Patient takes daily aspirin but no other anticoagulants.  Of note, patient has erythema of entire face with peeling skin which she says is secondary to going out in the sun after chemotherapy treatment for skin cancer.  That has been present for a while.   HPI  Past Medical History:  Diagnosis Date   GERD (gastroesophageal reflux disease)    Hypercholesteremia    Hypertension    Myocardial infarct (HCC)     There are no problems to display for this patient.   Past Surgical History:  Procedure Laterality Date   JOINT REPLACEMENT         Home Medications    Prior to Admission medications   Medication Sig Start Date End Date Taking? Authorizing Provider  amLODipine (NORVASC) 10 MG tablet Take 10 mg by mouth daily.    [provider]  aspirin 81 MG EC tablet aspirin 81 mg tablet,delayed release  TAKE 1 TABLET BY MOUTH TWICE DAILY AFTER MEALS FOR 14 DAYS START DAY AFTER SURGERY    [provider]  atorvastatin (LIPITOR) 80 MG tablet Take 80 mg by mouth daily.    [provider]  enalapril (VASOTEC) 20 MG tablet Take 20 mg by mouth daily.    [provider]  fluocinonide (LIDEX) 0.05 % external solution  06/23/22   [provider]  ipratropium (ATROVENT) 0.06 %  nasal spray Place 2 sprays into both nostrils 4 (four) times daily. 11/28/22   Domenick Gong, MD  naproxen (NAPROSYN) 500 MG tablet Take 500 mg by mouth 2 (two) times daily with a meal.    [provider]  promethazine-dextromethorphan (PROMETHAZINE-DM) 6.25-15 MG/5ML syrup Take 5 mLs by mouth 4 (four) times daily as needed for cough. 03/13/23   Immordino, Jeannett Senior, FNP  ranitidine (ZANTAC) 150 MG capsule Take 150 mg by mouth 2 (two) times daily.    [provider]  Spacer/Aero-Holding Chambers (AEROCHAMBER MV) inhaler Use as instructed 11/28/22   Domenick Gong, MD  fluticasone [facility]) 50 MCG/ACT nasal spray Place 1 spray into both nostrils daily. 04/14/18 08/10/20  Renford Dills, NP    Family History Family History  Problem Relation Age of Onset   Irregular heart beat Mother    Diabetes Father     Social History Social History   Tobacco Use   Smoking status: Never   Smokeless tobacco: Never  Vaping Use   Vaping status: Never Used  Substance Use Topics   Alcohol use: Yes    Alcohol/week: 2.0 standard drinks of alcohol    Types: 2 Cans of beer per week    Comment: beers per day   Drug use: Never     Allergies   Patient has no known allergies.   Review of Systems  Review of Systems  Constitutional:  Negative for fatigue.  HENT:  Positive for facial swelling. Negative for nosebleeds.   Eyes:  Negative for photophobia and visual disturbance.  Respiratory:  Negative for shortness of breath and wheezing.   Cardiovascular:  Positive for chest pain (L rib pain). Negative for palpitations.  Gastrointestinal:  Negative for abdominal pain, nausea and vomiting.  Musculoskeletal:  Negative for gait problem.  Skin:  Positive for color change, rash and wound.  Neurological:  Negative for dizziness, syncope, weakness, numbness and headaches.  Psychiatric/Behavioral:  Negative for confusion.      Physical Exam Triage Vital Signs ED Triage Vitals  Encounter  Vitals Group     BP 05/17/23 1116 130/69     Systolic BP Percentile --      Diastolic BP Percentile --      Pulse Rate 05/17/23 1116 73     Resp 05/17/23 1116 16     Temp 05/17/23 1116 97.8 F (36.6 C)     Temp Source 05/17/23 1116 Oral     SpO2 05/17/23 1116 96 %     Weight --      Height --      Head Circumference --      Peak Flow --      Pain Score 05/17/23 1115 6     Pain Loc --      Pain Education --      Exclude from Growth Chart --    No data found.  Updated Vital Signs BP 130/69 (BP Location: Right Arm)   Pulse 73   Temp 97.8 F (36.6 C) (Oral)   Resp 16   SpO2 96%       Physical Exam Vitals and nursing note reviewed.  Constitutional:      General: He is not in acute distress.    Appearance: Normal appearance. He is well-developed. He is not ill-appearing.     Comments: Small contusion and mild swelling left maxillary region with mild TTP. No step offs  HENT:     Head: Normocephalic.     Right Ear: Tympanic membrane, ear canal and external ear normal.     Left Ear: Tympanic membrane, ear canal and external ear normal.     Nose: Nose normal.     Mouth/Throat:     Mouth: Mucous membranes are moist.     Pharynx: Oropharynx is clear.  Eyes:     General: No scleral icterus.    Extraocular Movements: Extraocular movements intact.     Conjunctiva/sclera: Conjunctivae normal.     Pupils: Pupils are equal, round, and reactive to light.  Cardiovascular:     Rate and Rhythm: Normal rate and regular rhythm.     Heart sounds: Normal heart sounds.  Pulmonary:     Effort: Pulmonary effort is normal. No respiratory distress.     Breath sounds: Normal breath sounds.  Chest:     Chest wall: Tenderness (TTP along anterior/lateral ribs 5-7. No contusions/wounds of chest or abdomen) present.  Abdominal:     Palpations: Abdomen is soft.     Tenderness: There is no abdominal tenderness.  Musculoskeletal:     Cervical back: Neck supple.  Skin:    General: Skin is warm  and dry.     Capillary Refill: Capillary refill takes less than 2 seconds.     Findings: Rash (erythematous peeling rash of entire face) present.  Neurological:     General: No focal deficit present.     Mental  Status: He is alert and oriented to person, place, and time. Mental status is at baseline.     Cranial Nerves: No cranial nerve deficit.     Motor: No weakness.     Coordination: Coordination normal.     Gait: Gait normal.  Psychiatric:        Mood and Affect: Mood normal.        Behavior: Behavior normal.      UC Treatments / Results  Labs (all labs ordered are listed, but only abnormal results are displayed) Labs Reviewed - No data to display  EKG   Radiology DG Ribs Unilateral W/Chest Left  Result Date: 05/17/2023 CLINICAL DATA:  Left rib pain after falling at home. EXAM: LEFT RIBS AND CHEST - 3+ VIEW COMPARISON:  Chest radiographs 11/28/2022 and 07/15/2021. FINDINGS: The heart size and mediastinal contours are stable. The lungs appear clear. There is no pleural effusion or pneumothorax. No acute left-sided rib fractures are identified. Scattered radiodensities projecting over the left upper quadrant the abdomen may reflect ingested objects or overlying foreign bodies. There are degenerative changes in the spine. Previous right shoulder arthroplasty with evidence of a chronic rotator cuff tear on the left. IMPRESSION: 1. No evidence of acute left-sided rib fracture or pneumothorax. 2. Scattered radiodensities projecting over the left upper quadrant the abdomen may reflect ingested objects or overlying foreign bodies. Electronically Signed   By: Carey Bullocks M.D.   On: 05/17/2023 14:53    Procedures Procedures (including critical care time)  Medications Ordered in UC Medications - No data to display  Initial Impression / Assessment and Plan / UC Course  I have reviewed the triage vital signs and the nursing notes.  Pertinent labs & imaging results that were  available during my care of the patient were reviewed by me and considered in my medical decision making (see chart for details).   72 year old male presents for injuries following accidental fall after tripping over wife's shoe a couple of hours ago.  Reports contusion and mild swelling of left cheek and soreness of his left ribs.  Denies shortness of breath but has increased discomfort with laughing, coughing and breathing.  Denies loss of consciousness, dizziness, headaches, vomiting, weakness, numbness/tingling.  Vitals are normal and stable and he is overall well-appearing.  Normal neurological exam.  He has tenderness of the left anterior/lateral ribs 5 through 7 but no contusions, abrasions or wounds.  Chest is clear.  Mild swelling of the left cheek with small contusion.  Tenderness of the maxillary area without step-offs.  Peeling erythematous rash of entire face.  Left rib/chest x-ray obtained to assess for possible rib fracture.  Wet read negative.  Discussed with patient.  Advised him I will call him if the over read is positive for rib fracture.  We discussed care of rib fracture and rib injury.  Advised RICE guidelines, Tylenol, ibuprofen or Aleve.  Thoroughly discussed care of head injury as well.  Advised going to emergency department if severe/significant headaches, vision changes, nausea/vomiting, numbness/tingling or weakness, confusion, dizziness, difficulty walking or speaking.  Advised in that case he would need a CT scan.  Patient is agreeable.  Radiologist does not note any rib fractures or pneumothorax.  There are scattered radiodensities projecting over the left upper quadrant of the abdomen which radiologist states may reflect ingested objects or overlying foreign bodies.  I contacted patient to discuss this.  He states that he had a breakfast sandwich for [employer] today.  It was a  paper wrapper and he denies any possibility of ingesting any foreign objects.  No wounds to his  skin or areas where he could have had a foreign body.  X-ray from May does not show the radiodensities.  I am thinking it is possible this could have been something on his shirt.  Advised him to monitor his symptoms and if he develops abdominal pain, nausea/vomiting, black or bloody stool he is to go to the ER.  He is not having any abdominal pain.  He is supposed to follow-up with his primary care provider soon to have lab work done.  Advised discussing with them that he had an abnormal x-ray.  They may want to obtain further imaging to look at this area further.   Final Clinical Impressions(s) / UC Diagnoses   Final diagnoses:  Rib pain on left side  Contusion of face, initial encounter  Accidental fall, initial encounter     Discharge Instructions      -I did not see any fractures on your x-rays but I will call you if the radiologist determines that you have any fractures or other acute abnormalities on your imaging. - Ice your face and ribs.  Take Tylenol and or Aleve for discomfort. - If you do indeed have a rib fracture the treatment is essentially the same with trying to control the pain. - If you have any acute worsening of your pain you should go to the emergency department.  If you have any increased difficulty breathing please go to the ER. - Consider following up with your primary care provider for reevaluation if your symptoms are not improving. - Go to emergency department or call 911 if you have any severe/significant headaches, vision changes, nausea/vomiting, numbness/tingling or weakness, confusion, difficulty walking, etc.  There is to be signs of a major head injury.     ED Prescriptions   None    PDMP not reviewed this encounter.   Shirlee Latch, PA-C 05/17/23 [phone]

## 2023-05-18 ENCOUNTER — Ambulatory Visit: Payer: Self-pay

## 2024-01-24 ENCOUNTER — Ambulatory Visit
Admission: RE | Admit: 2024-01-24 | Discharge: 2024-01-24 | Disposition: A | Discharge status: Home / Self Care | Payer: Self-pay | Source: Ambulatory Visit

## 2024-01-24 VITALS — BP 143/67 | HR 56 | Temp 98.5°F | Resp 17 | Ht 71.0 in | Wt 220.0 lb

## 2024-01-24 DIAGNOSIS — J0100 Acute maxillary sinusitis, unspecified: Secondary | ICD-10-CM

## 2024-01-24 MED ORDER — AMOXICILLIN-POT CLAVULANATE 875-125 MG PO TABS: 1.0000 | ORAL_TABLET | Freq: Two times a day (BID) | ORAL | 0 refills | Status: AC

## 2024-01-24 MED ORDER — IPRATROPIUM BROMIDE 0.06 % NA SOLN: 2.0000 | Freq: Four times a day (QID) | NASAL | 0 refills | Status: DC

## 2024-01-24 NOTE — ED Triage Notes (Signed)
 Pt states that he has facial pain, coughing, chest congestion, nasal congestion and headache. X1 month

## 2024-01-24 NOTE — ED Provider Notes (Signed)
 [facility]    CSN: [id number] Arrival date & time: 01/24/24  9188      History   Chief Complaint Chief Complaint  Patient presents with   Cough    Sinus infection most likely - Entered by patient    HPI Richard Jensen is a 73 y.o. male presenting for 1 month history of fatigue, congestion, sinus pain/pressure, sore throat and post nasal drainage. He denies fever, cough, chest pain, wheezing or breathing difficulty, vomiting or diarrhea. Denies any sick contacts.  He has been taking OTC meds without relief.  He has a personal history of sinus infections and thinks he currently has a sinus infection.  HPI  Past Medical History:  Diagnosis Date   GERD (gastroesophageal reflux disease)    Hypercholesteremia    Hypertension    Myocardial infarct (HCC)     There are no active problems to display for this patient.   Past Surgical History:  Procedure Laterality Date   JOINT REPLACEMENT         Home Medications    Prior to Admission medications   Medication Sig Start Date End Date Taking? Authorizing Provider  amoxicillin -clavulanate (AUGMENTIN ) 875-125 MG tablet Take 1 tablet by mouth every 12 (twelve) hours for 7 days. 01/24/24 01/31/24 Yes Arvis Jolan NOVAK, PA-C  aspirin 81 MG EC tablet aspirin 81 mg tablet,delayed release  TAKE 1 TABLET BY MOUTH TWICE DAILY AFTER MEALS FOR 14 DAYS START DAY AFTER SURGERY   Yes [provider]  atorvastatin (LIPITOR) 80 MG tablet Take 80 mg by mouth daily.   Yes [provider]  enalapril (VASOTEC) 20 MG tablet Take 20 mg by mouth daily.   Yes [provider]  ipratropium (ATROVENT ) 0.06 % nasal spray Place 2 sprays into both nostrils 4 (four) times daily. 01/24/24  Yes Arvis Jolan B, PA-C  rosuvastatin (CRESTOR) 20 MG tablet Take 20 mg by mouth at bedtime.   Yes [provider]  Spacer/Aero-Holding Chambers (AEROCHAMBER MV) inhaler Use as instructed 11/28/22  Yes Van Knee, MD   spironolactone (ALDACTONE) 25 MG tablet Take 25 mg by mouth. 01/02/24  Yes [provider]  amLODipine (NORVASC) 10 MG tablet Take 10 mg by mouth daily.    [provider]  fluocinonide (LIDEX) 0.05 % external solution  06/23/22   [provider]  naproxen (NAPROSYN) 500 MG tablet Take 500 mg by mouth 2 (two) times daily with a meal.    [provider]  ranitidine (ZANTAC) 150 MG capsule Take 150 mg by mouth 2 (two) times daily.    [provider]  fluticasone  (FLONASE ) 50 MCG/ACT nasal spray Place 1 spray into both nostrils daily. 04/14/18 08/10/20  Cleotilde Jacobsen, NP    Family History Family History  Problem Relation Age of Onset   Irregular heart beat Mother    Diabetes Father     Social History Social History   Tobacco Use   Smoking status: Never   Smokeless tobacco: Never  Vaping Use   Vaping status: Never Used  Substance Use Topics   Alcohol use: Yes    Alcohol/week: 2.0 standard drinks of alcohol    Types: 2 Cans of beer per week    Comment: beers per day   Drug use: Never     Allergies   Patient has no known allergies.   Review of Systems Review of Systems  Constitutional:  Positive for fatigue. Negative for fever.  HENT:  Positive for congestion, postnasal drip, rhinorrhea,  sinus pressure, sinus pain and sore throat. Negative for ear pain.   Respiratory:  Negative for cough, shortness of breath and wheezing.   Cardiovascular:  Negative for chest pain.  Gastrointestinal:  Negative for abdominal pain, diarrhea, nausea and vomiting.  Musculoskeletal:  Negative for myalgias.  Neurological:  Negative for weakness, light-headedness and headaches.  Hematological:  Negative for adenopathy.     Physical Exam Triage Vital Signs No data found.  Updated Vital Signs BP (!) 143/67 (BP Location: Right Arm)   Pulse (!) 56   Temp 98.5 F (36.9 C) (Oral)   Resp 17   Ht 5' 11 (1.803 m)   Wt 220 lb (99.8 kg)   SpO2 96%    BMI 30.68 kg/m      Physical Exam Vitals and nursing note reviewed.  Constitutional:      General: He is not in acute distress.    Appearance: Normal appearance. He is well-developed. He is not ill-appearing.  HENT:     Head: Normocephalic and atraumatic.     Right Ear: Tympanic membrane, ear canal and external ear normal.     Left Ear: Tympanic membrane, ear canal and external ear normal.     Nose: Congestion present.     Mouth/Throat:     Mouth: Mucous membranes are moist.     Pharynx: Oropharynx is clear.  Eyes:     General: No scleral icterus.    Conjunctiva/sclera: Conjunctivae normal.  Cardiovascular:     Rate and Rhythm: Normal rate and regular rhythm.     Heart sounds: Normal heart sounds.  Pulmonary:     Effort: Pulmonary effort is normal. No respiratory distress.     Breath sounds: No wheezing, rhonchi or rales.  Musculoskeletal:     Cervical back: Neck supple.  Skin:    General: Skin is warm and dry.     Capillary Refill: Capillary refill takes less than 2 seconds.  Neurological:     General: No focal deficit present.     Mental Status: He is alert. Mental status is at baseline.     Motor: No weakness.     Gait: Gait normal.  Psychiatric:        Mood and Affect: Mood normal.        Behavior: Behavior normal.      UC Treatments / Results  Labs (all labs ordered are listed, but only abnormal results are displayed) Labs Reviewed - No data to display  EKG   Radiology No results found.  Procedures Procedures (including critical care time)  Medications Ordered in UC Medications - No data to display  Initial Impression / Assessment and Plan / UC Course  I have reviewed the triage vital signs and the nursing notes.  Pertinent labs & imaging results that were available during my care of the patient were reviewed by me and considered in my medical decision making (see chart for details).   73 year old male presents for 1 month history of nasal  congestion, sinus pain/pressure, sore throat and postnasal drainage.  Patient has been taking over-the-counter medications without relief.  Believes he has a sinus infection.  Vitals normal stable patient is overall well-appearing.  On exam he has nasal congestion.  Throat is clear.  Chest clear and heart regular rate and rhythm.    Suspect acute sinusitis.  Will treat at this time with Augmentin  given along he has been ill.  Also sent Atrovent  nasal spray and advised him to continue with his antihistamines.  Reviewed return and ED precautions.   Final Clinical Impressions(s) / UC Diagnoses   Final diagnoses:  Acute maxillary sinusitis, recurrence not specified   Discharge Instructions   None     ED Prescriptions     Medication Sig Dispense Auth. Provider   amoxicillin -clavulanate (AUGMENTIN ) 875-125 MG tablet Take 1 tablet by mouth every 12 (twelve) hours for 7 days. 14 tablet Arvis Huxley B, PA-C   ipratropium (ATROVENT ) 0.06 % nasal spray Place 2 sprays into both nostrils 4 (four) times daily. 15 mL Arvis Huxley NOVAK, PA-C      PDMP not reviewed this encounter.     Arvis Huxley NOVAK, PA-C 01/24/24 [phone]

## 2024-01-29 ENCOUNTER — Ambulatory Visit: Payer: Self-pay

## 2024-06-08 ENCOUNTER — Inpatient Hospital Stay
Admission: RE | Admit: 2024-06-08 | Discharge: 2024-06-08 | Payer: Self-pay | Attending: Emergency Medicine | Admitting: Emergency Medicine

## 2024-06-08 ENCOUNTER — Ambulatory Visit

## 2024-06-08 VITALS — BP 133/69 | HR 67 | Temp 97.5°F | Resp 16 | Wt 217.1 lb

## 2024-06-08 DIAGNOSIS — S76012A Strain of muscle, fascia and tendon of left hip, initial encounter: Secondary | ICD-10-CM | POA: Diagnosis not present

## 2024-06-08 DIAGNOSIS — M25552 Pain in left hip: Secondary | ICD-10-CM

## 2024-06-08 MED ORDER — PREDNISONE 20 MG PO TABS: 40.0000 mg | ORAL_TABLET | Freq: Every day | ORAL | 0 refills | Status: AC

## 2024-06-08 MED ORDER — HYDROCODONE-ACETAMINOPHEN 5-325 MG PO TABS: 1.0000 | ORAL_TABLET | Freq: Four times a day (QID) | ORAL | 0 refills | Status: AC | PRN

## 2024-06-08 MED ORDER — NAPROXEN 500 MG PO TABS: 500.0000 mg | ORAL_TABLET | Freq: Two times a day (BID) | ORAL | 0 refills | Status: AC

## 2024-06-08 NOTE — ED Provider Notes (Addendum)
 HPI  SUBJECTIVE:  Richard Jensen is a 73 y.o. male who presents with left buttock pain that radiates into his groin starting 7 days ago after squatting, picking up a heavy block of wood.  He describes the pain as constant, throbbing.  He is able to move his hip without any problem, but reports pain worsening with large positional changes and standing for prolonged periods of time.  He reports weakness in his left leg secondary to pain.  He has been taking 880 mg of Naprosyn  every morning without improvement in his symptoms.  He had a fall 8 days ago where he tripped on a limb, fell onto his lumbar spine but was fine after the fall.  He had no back pain, and still denies back pain.  He denies hitting his head.  No urinary fecal incontinence, urinary retention, bilateral radicular leg pain.  He has a past medical history of MI, hypertension, hypercholesterolemia.  No history of osteoporosis.  Past Medical History:  Diagnosis Date   GERD (gastroesophageal reflux disease)    Hypercholesteremia    Hypertension    Myocardial infarct [facility])     Past Surgical History:  Procedure Laterality Date   JOINT REPLACEMENT      Family History  Problem Relation Age of Onset   Irregular heart beat Mother    Diabetes Father     Social History   Tobacco Use   Smoking status: Never   Smokeless tobacco: Never  Vaping Use   Vaping status: Never Used  Substance Use Topics   Alcohol use: Yes    Alcohol/week: 2.0 standard drinks of alcohol    Types: 2 Cans of beer per week    Comment: beers per day   Drug use: Never    No current facility-administered medications for this encounter.  Current Outpatient Medications:    HYDROcodone -acetaminophen  (NORCO/VICODIN) 5-325 MG tablet, Take 1-2 tablets by mouth every 6 (six) hours as needed for moderate pain (pain score 4-6) or severe pain (pain score 7-10)., Disp: 12 tablet, Rfl: 0   naproxen  (NAPROSYN ) 500 MG tablet, Take 1 tablet (500 mg total) by mouth 2  (two) times daily., Disp: 20 tablet, Rfl: 0   predniSONE  (DELTASONE ) 20 MG tablet, Take 2 tablets (40 mg total) by mouth daily with breakfast for 5 days., Disp: 10 tablet, Rfl: 0   amLODipine (NORVASC) 10 MG tablet, Take 10 mg by mouth daily., Disp: , Rfl:    aspirin 81 MG EC tablet, aspirin 81 mg tablet,delayed release  TAKE 1 TABLET BY MOUTH TWICE DAILY AFTER MEALS FOR 14 DAYS START DAY AFTER SURGERY, Disp: , Rfl:    atorvastatin (LIPITOR) 80 MG tablet, Take 80 mg by mouth daily., Disp: , Rfl:    enalapril (VASOTEC) 20 MG tablet, Take 20 mg by mouth daily., Disp: , Rfl:    fluocinonide (LIDEX) 0.05 % external solution, , Disp: , Rfl:    ranitidine (ZANTAC) 150 MG capsule, Take 150 mg by mouth 2 (two) times daily., Disp: , Rfl:    rosuvastatin (CRESTOR) 20 MG tablet, Take 20 mg by mouth at bedtime., Disp: , Rfl:    spironolactone (ALDACTONE) 25 MG tablet, Take 25 mg by mouth., Disp: , Rfl:   No Known Allergies   ROS  As noted in HPI.   Physical Exam  BP 133/69 (BP Location: Left Arm)   Pulse 67   Temp (!) 97.5 F (36.4 C) (Oral)   Resp 16   Wt 98.5 kg   SpO2  100%   BMI 30.28 kg/m   Constitutional: Well developed, well nourished, no acute distress Eyes:  EOMI, conjunctiva normal bilaterally HENT: Normocephalic, atraumatic,mucus membranes moist Respiratory: Normal inspiratory effort Cardiovascular: Normal rate GI: nondistended.  skin: No rash, skin intact Musculoskeletal: no CVAT.  No paralumbar tenderness, no muscle spasm. No L-spine, SI joint, sacral bony tenderness. Bilateral intact PT pulses.  Sensation between thighs intact.  Pain with active left hip flexion/extension, passive left hip abduction.  Tenderness along the posterior acetabulum and area superior to the hip.  Positive tenderness over the lateral left hip.  SLR neg bilaterally. Sensation baseline light touch bilaterally for Pt, DTR's symmetric and intact bilaterally KJ, Motor symmetric bilateral 5/5 hip flexion,  quadriceps, hamstrings, EHL, foot dorsiflexion, foot plantarflexion, gait somewhat antalgic but without apparent new ataxia. Neurologic: Alert & oriented x 3, no focal neuro deficits Psychiatric: Speech and behavior appropriate   ED Course   Medications - No data to display  Orders Placed This Encounter  Procedures   DG Hip Unilat With Pelvis 2-3 Views Left    Fall 1 week ago.  Tenderness left lateral hip and iliac crest.  pelvis stable.  Rule out pelvic, hip fracture    Standing Status:   Standing    Number of Occurrences:   1    Symptom/Reason for Exam:   Trauma [[id number]]    No results found for this or any previous visit (from the past 24 hours). DG Hip Unilat With Pelvis 2-3 Views Left Result Date: 06/08/2024 EXAM: 2 or 3 VIEW(S) XRAY OF THE LEFT HIP 06/08/2024 10:00:00 AM COMPARISON: None available. CLINICAL HISTORY: Trauma. FINDINGS: BONES AND JOINTS: No acute fracture. No malalignment. SOFT TISSUES: There are prominent vascular calcifications. LUMBAR SPINE: There are moderate degenerative changes within the lower lumbar spine at L3-L4 and L4-L5. IMPRESSION: 1. No evidence of acute traumatic injury. 2. Moderate degenerative changes within the lower lumbar spine at L3-4 and L4-5. Electronically signed by: Evalene Coho MD 06/08/2024 10:31 AM EST RP Workstation: [id number]    ED Clinical Impression  1. Hip strain, left, initial encounter   2. Left hip pain     ED Assessment/Plan    Outside labs reviewed.  Calculated creatinine clearance on labs done in October 2025 72 mL/min.  Do not need to renally dose this.  Patient has no L-spine tenderness, and his pain is not located in his back, rather it is located in his hip/pelvis.  He has tenderness to palpation along his lateral hip and superior to that.  His pelvis is stable.  Will x-ray left hip/pelvis.  Deferring imaging of the L-spine as he has no back pain and no tenderness in that area.  Reviewed imaging independently and  discussed with radiology.  No displaced fracture over the surgical neck, greater trochanter or along the shaft.  Moderate degenerative changes in the lower lumbar spine.  See radiology report for details.  Will send home with Naprosyn  combined with a Tylenol  containing product twice a day, prednisone  40 mg for 5 days, have him follow-up with orthopedics if still symptomatic in a week for reevaluation and possible advanced imaging.  Discussed with him that not all fractures show up on x-ray, sometimes they need advanced imaging such as CT.    [place]  narcotic database reviewed.  Feel it is appropriate to proceed with a narcotic prescription.  Patient had 5 oxycodone in December 2024.  No other opiate prescriptions in the past 2 years  Discussed labs, imaging, MDM, treatment  plan, and plan for follow-up with patient. Discussed sn/sx that should prompt return to the ED. patient agrees with plan.   Meds ordered this encounter  Medications   naproxen  (NAPROSYN ) 500 MG tablet    Sig: Take 1 tablet (500 mg total) by mouth 2 (two) times daily.    Dispense:  20 tablet    Refill:  0   HYDROcodone -acetaminophen  (NORCO/VICODIN) 5-325 MG tablet    Sig: Take 1-2 tablets by mouth every 6 (six) hours as needed for moderate pain (pain score 4-6) or severe pain (pain score 7-10).    Dispense:  12 tablet    Refill:  0   predniSONE  (DELTASONE ) 20 MG tablet    Sig: Take 2 tablets (40 mg total) by mouth daily with breakfast for 5 days.    Dispense:  10 tablet    Refill:  0    *This clinic note was created using Scientist, clinical (histocompatibility and immunogenetics). Therefore, there may be occasional mistakes despite careful proofreading.  ?     Van Knee, MD 06/08/24 [id number]    Van Knee, MD 06/08/24 [phone]

## 2024-06-08 NOTE — ED Triage Notes (Signed)
 Back pain  Richard Jensen last Saturday  Was knocking down trees and picked up a heavy one and fell backwards Denies hitting head  Unable to move as much

## 2024-06-08 NOTE — Discharge Instructions (Signed)
 Not all fractures show up on x-ray.  Follow-up with [facility] if you are still having symptoms in a week.  In the meantime, take the Naprosyn  combined with a Tylenol  containing product twice a day.  Either Naprosyn  with 1000 mg of Tylenol  for mild-moderate pain or Naprosyn  with 1-2 Norco for severe pain.  The prednisone  will help with pain and inflammation as well.

## 2024-06-18 ENCOUNTER — Other Ambulatory Visit: Payer: Self-pay | Admitting: Physician Assistant

## 2024-06-18 ENCOUNTER — Ambulatory Visit
Admission: RE | Admit: 2024-06-18 | Discharge: 2024-06-18 | Disposition: A | Discharge status: Home / Self Care | Source: Ambulatory Visit | Attending: Physician Assistant | Admitting: Physician Assistant

## 2024-06-18 DIAGNOSIS — S32302A Unspecified fracture of left ilium, initial encounter for closed fracture: Secondary | ICD-10-CM | POA: Insufficient documentation
